# Patient Record
Sex: Male | Born: 2000 | Hispanic: Yes | Marital: Married | Smoking: Never smoker
Health system: Southern US, Community
[De-identification: ages and names within clinical notes are randomized; demographics above are authoritative.]

## PROBLEM LIST (undated history)

## (undated) ENCOUNTER — Emergency Department (HOSPITAL_COMMUNITY): Admission: EM | Payer: Self-pay | Source: Home / Self Care

## (undated) DIAGNOSIS — Z8489 Family history of other specified conditions: Secondary | ICD-10-CM

## (undated) DIAGNOSIS — T8859XA Other complications of anesthesia, initial encounter: Secondary | ICD-10-CM

## (undated) DIAGNOSIS — E559 Vitamin D deficiency, unspecified: Secondary | ICD-10-CM

## (undated) DIAGNOSIS — F410 Panic disorder [episodic paroxysmal anxiety] without agoraphobia: Secondary | ICD-10-CM

## (undated) HISTORY — PX: WISDOM TOOTH EXTRACTION: SHX21

---
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
## 2019-10-18 ENCOUNTER — Other Ambulatory Visit: Payer: Self-pay | Admitting: *Deleted

## 2019-10-18 DIAGNOSIS — Z20822 Contact with and (suspected) exposure to covid-19: Secondary | ICD-10-CM

## 2019-10-19 LAB — NOVEL CORONAVIRUS, NAA: SARS-CoV-2, NAA: NOT DETECTED

## 2020-08-17 ENCOUNTER — Encounter (HOSPITAL_COMMUNITY): Payer: Self-pay | Admitting: Emergency Medicine

## 2020-08-17 ENCOUNTER — Emergency Department (HOSPITAL_COMMUNITY)
Admission: EM | Admit: 2020-08-17 | Discharge: 2020-08-18 | Disposition: A | Discharge status: Home / Self Care | Payer: Self-pay | Attending: Emergency Medicine | Admitting: Emergency Medicine

## 2020-08-17 ENCOUNTER — Other Ambulatory Visit: Payer: Self-pay

## 2020-08-17 DIAGNOSIS — R310 Gross hematuria: Secondary | ICD-10-CM | POA: Insufficient documentation

## 2020-08-17 LAB — COMPREHENSIVE METABOLIC PANEL
ALT: 16 U/L (ref 0–44)
AST: 24 U/L (ref 15–41)
Albumin: 4.2 g/dL (ref 3.5–5.0)
Alkaline Phosphatase: 80 U/L (ref 38–126)
Anion gap: 8 (ref 5–15)
BUN: 11 mg/dL (ref 6–20)
CO2: 25 mmol/L (ref 22–32)
Calcium: 9.2 mg/dL (ref 8.9–10.3)
Chloride: 105 mmol/L (ref 98–111)
Creatinine, Ser: 0.88 mg/dL (ref 0.61–1.24)
GFR calc Af Amer: >60 mL/min (ref >60)
GFR calc non Af Amer: >60 mL/min (ref >60)
Glucose, Bld: 81 mg/dL (ref 70–99)
Potassium: 3.7 mmol/L (ref 3.5–5.1)
Sodium: 138 mmol/L (ref 135–145)
Total Bilirubin: 0.9 mg/dL (ref 0.3–1.2)
Total Protein: 7 g/dL (ref 6.5–8.1)

## 2020-08-17 LAB — URINALYSIS, ROUTINE W REFLEX MICROSCOPIC
Bacteria, UA: NONE SEEN
Bilirubin Urine: NEGATIVE
Glucose, UA: NEGATIVE mg/dL
Ketones, ur: NEGATIVE mg/dL
Leukocytes,Ua: NEGATIVE
Nitrite: NEGATIVE
Protein, ur: NEGATIVE mg/dL
RBC / HPF: >50 RBC/hpf — ABNORMAL HIGH (ref 0–5)
Specific Gravity, Urine: 1.006 (ref 1.005–1.030)
pH: 7 (ref 5.0–8.0)

## 2020-08-17 LAB — CBC
HCT: 44.1 % (ref 39.0–52.0)
Hemoglobin: 15.3 g/dL (ref 13.0–17.0)
MCH: 31.5 pg (ref 26.0–34.0)
MCHC: 34.7 g/dL (ref 30.0–36.0)
MCV: 90.7 fL (ref 80.0–100.0)
Platelets: 229 10*3/uL (ref 150–400)
RBC: 4.86 MIL/uL (ref 4.22–5.81)
RDW: 11.8 % (ref 11.5–15.5)
WBC: 10.4 10*3/uL (ref 4.0–10.5)
nRBC: 0 % (ref 0.0–0.2)

## 2020-08-17 LAB — LIPASE, BLOOD: Lipase: 28 U/L (ref 11–51)

## 2020-08-17 NOTE — ED Triage Notes (Signed)
Pt states he went swimming today and after his shower he noted blood in his urine. Blood in urine began today, and pt denies any injury.

## 2020-08-17 NOTE — ED Triage Notes (Signed)
Pt denies any pain during urination.

## 2020-08-18 ENCOUNTER — Emergency Department (HOSPITAL_COMMUNITY): Payer: Self-pay

## 2020-08-18 MED ORDER — IBUPROFEN 400 MG PO TABS
600.0000 mg | ORAL_TABLET | Freq: Once | ORAL | Status: AC
  Administered 2020-08-18: 12:00:00 600 mg via ORAL
  Filled 2020-08-18: qty 1

## 2020-08-18 NOTE — ED Provider Notes (Signed)
[facility] EMERGENCY DEPARTMENT Provider Note   CSN: [id number] Arrival date & time: 08/17/20  1914     History Chief Complaint  Patient presents with  . Hematuria    Jese Comella is a 19 y.o. male.  Patient is a 19 year old male with no significant past medical history who presents with hematuria.  He was swimming yesterday and after he got on the plane he noticed that when he urinated, there was blood in his urine.  He said it was all pink-tinged without clots.  He had another episode later in the day but has not had any blood in his urine that is visible today.  He does have some pain in his left flank that started yesterday.  It is in his left mid abdomen and radiates a little bit to his back.  Its been intermittent.  No history of kidney stones.  No fevers.  No nausea or vomiting.  No other recent illnesses.  He has not taken anything for the pain.        History reviewed. No pertinent past medical history.  There are no problems to display for this patient.   Past Surgical History:  Procedure Laterality Date  . WISDOM TOOTH EXTRACTION         No family history on file.  Social History   Tobacco Use  . Smoking status: Never Smoker  . Smokeless tobacco: Never Used  Substance Use Topics  . Alcohol use: No  . Drug use: No    Home Medications Prior to Admission medications   Medication Sig Start Date End Date Taking? Authorizing Provider  omeprazole (PRILOSEC) 20 MG capsule Take 1 capsule (20 mg total) by mouth daily. 11/19/17   Maczis, Elmer Sow, PA-C    Allergies    Patient has no known allergies.  Review of Systems   Review of Systems  Constitutional: Negative for chills, diaphoresis, fatigue and fever.  HENT: Negative for congestion, rhinorrhea and sneezing.   Eyes: Negative.   Respiratory: Negative for cough, chest tightness and shortness of breath.   Cardiovascular: Negative for chest pain and leg swelling.    Gastrointestinal: Positive for abdominal pain. Negative for blood in stool, diarrhea, nausea and vomiting.  Genitourinary: Positive for hematuria. Negative for difficulty urinating, flank pain and frequency.  Musculoskeletal: Negative for arthralgias and back pain.  Skin: Negative for rash.  Neurological: Negative for dizziness, speech difficulty, weakness, numbness and headaches.    Physical Exam Updated Vital Signs BP 100/72 (BP Location: Right Arm)   Pulse (!) 112   Temp 98.7 F (37.1 C) (Oral)   Resp 16   Ht 5\' 7"  (1.702 m)   Wt 54.4 kg   SpO2 96%   BMI 18.79 kg/m   Physical Exam Constitutional:      Appearance: He is well-developed.  HENT:     Head: Normocephalic and atraumatic.  Eyes:     Pupils: Pupils are equal, round, and reactive to light.  Cardiovascular:     Rate and Rhythm: Normal rate and regular rhythm.     Heart sounds: Normal heart sounds.  Pulmonary:     Effort: Pulmonary effort is normal. No respiratory distress.     Breath sounds: Normal breath sounds. No wheezing or rales.  Chest:     Chest wall: No tenderness.  Abdominal:     General: Bowel sounds are normal.     Palpations: Abdomen is soft.     Tenderness: There is abdominal tenderness. There is  no guarding or rebound.     Comments: Mild tenderness to the left mid abdomen, no pain to the inguinal areas or testicles  Musculoskeletal:        General: Normal range of motion.     Cervical back: Normal range of motion and neck supple.  Lymphadenopathy:     Cervical: No cervical adenopathy.  Skin:    General: Skin is warm and dry.     Findings: No rash.  Neurological:     Mental Status: He is alert and oriented to person, place, and time.     ED Results / Procedures / Treatments   Labs (all labs ordered are listed, but only abnormal results are displayed) Labs Reviewed  URINALYSIS, ROUTINE W REFLEX MICROSCOPIC - Abnormal; Notable for the following components:      Result Value   Color,  Urine STRAW (*)    Hgb urine dipstick LARGE (*)    RBC / HPF >50 (*)    All other components within normal limits  LIPASE, BLOOD  COMPREHENSIVE METABOLIC PANEL  CBC    EKG None  Radiology CT Renal Stone Study  Result Date: 08/18/2020 CLINICAL DATA:  Left lower quadrant and flank pain since this morning. EXAM: CT ABDOMEN AND PELVIS WITHOUT CONTRAST TECHNIQUE: Multidetector CT imaging of the abdomen and pelvis was performed following the standard protocol without IV contrast. COMPARISON:  11/19/2017 CT abdomen/pelvis. FINDINGS: Lower chest: No significant pulmonary nodules or acute consolidative airspace disease. Hepatobiliary: Normal liver size. No liver mass. Normal gallbladder with no radiopaque cholelithiasis. No biliary ductal dilatation. Pancreas: Normal, with no mass or duct dilation. Spleen: Normal size. No mass. Adrenals/Urinary Tract: Normal adrenals. No renal stones. No hydronephrosis. No contour deforming renal masses. Normal caliber ureters. No ureteral stones. Collapsed normal bladder. Stomach/Bowel: Normal non-distended stomach. Normal caliber small bowel with no small bowel wall thickening. Normal appendix. Normal large bowel with no diverticulosis, large bowel wall thickening or pericolonic fat stranding. Vascular/Lymphatic: Normal caliber abdominal aorta. No pathologically enlarged lymph nodes in the abdomen or pelvis. Reproductive: Normal size prostate. Other: No pneumoperitoneum, ascites or focal fluid collection. Musculoskeletal: No aggressive appearing focal osseous lesions. IMPRESSION: No acute abnormality. No urolithiasis. No hydronephrosis. No evidence of bowel obstruction or acute bowel inflammation. Electronically Signed   By: Delbert Phenix M.D.   On: 08/18/2020 11:20    Procedures Procedures (including critical care time)  Medications Ordered in ED Medications - No data to display  ED Course  I have reviewed the triage vital signs and the nursing notes.  Pertinent  labs & imaging results that were available during my care of the patient were reviewed by me and considered in my medical decision making (see chart for details).    MDM Rules/Calculators/A&P                         Patient is a 19 year old male who presents with hematuria.  He also has some left flank pain.  His urine has cleared per his report.  Urinalysis does show hematuria but does not appear to be consistent with infection.  His pain is well controlled in the ED.  He was given dose of ibuprofen.  CT scan shows no acute abnormality.  I suspect that he has a recently passed stone.  His hematuria has cleared.  His pain is minimal.  He was discharged home in good condition.  He does not have a PCP so was referred to urology to have  his urine rechecked.  Return precautions were given. Final Clinical Impression(s) / ED Diagnoses Final diagnoses:  Gross hematuria    Rx / DC Orders ED Discharge Orders    None       Rolan Bucco, MD 08/18/20 1157

## 2020-08-18 NOTE — ED Notes (Signed)
Discharge teaching reviewed with pt. Pt verbalized understanding. 

## 2021-05-21 ENCOUNTER — Encounter (HOSPITAL_BASED_OUTPATIENT_CLINIC_OR_DEPARTMENT_OTHER): Payer: Self-pay | Admitting: *Deleted

## 2021-05-21 ENCOUNTER — Other Ambulatory Visit: Payer: Self-pay

## 2021-05-21 ENCOUNTER — Other Ambulatory Visit (HOSPITAL_BASED_OUTPATIENT_CLINIC_OR_DEPARTMENT_OTHER): Payer: Self-pay

## 2021-05-21 ENCOUNTER — Emergency Department (HOSPITAL_BASED_OUTPATIENT_CLINIC_OR_DEPARTMENT_OTHER)
Admission: EM | Admit: 2021-05-21 | Discharge: 2021-05-21 | Disposition: A | Discharge status: Home / Self Care | Payer: Self-pay | Attending: Emergency Medicine | Admitting: Emergency Medicine

## 2021-05-21 DIAGNOSIS — Z202 Contact with and (suspected) exposure to infections with a predominantly sexual mode of transmission: Secondary | ICD-10-CM | POA: Insufficient documentation

## 2021-05-21 LAB — URINALYSIS, ROUTINE W REFLEX MICROSCOPIC
Bilirubin Urine: NEGATIVE
Glucose, UA: NEGATIVE mg/dL
Hgb urine dipstick: NEGATIVE
Ketones, ur: NEGATIVE mg/dL
Leukocytes,Ua: NEGATIVE
Nitrite: NEGATIVE
Protein, ur: NEGATIVE mg/dL
Specific Gravity, Urine: 1.02 (ref 1.005–1.030)
pH: 6.5 (ref 5.0–8.0)

## 2021-05-21 LAB — HIV ANTIBODY (ROUTINE TESTING W REFLEX): HIV Screen 4th Generation wRfx: NONREACTIVE

## 2021-05-21 MED ORDER — LIDOCAINE HCL (PF) 1 % IJ SOLN
1.0000 mL | Freq: Once | INTRAMUSCULAR | Status: AC
  Administered 2021-05-21: 1 mL
  Filled 2021-05-21: qty 5

## 2021-05-21 MED ORDER — CEFTRIAXONE SODIUM 500 MG IJ SOLR
500.0000 mg | Freq: Once | INTRAMUSCULAR | Status: AC
  Administered 2021-05-21: 500 mg via INTRAMUSCULAR
  Filled 2021-05-21: qty 500

## 2021-05-21 MED ORDER — DOXYCYCLINE HYCLATE 100 MG PO CAPS
100.0000 mg | ORAL_CAPSULE | Freq: Two times a day (BID) | ORAL | 0 refills | Status: DC
  Filled 2021-05-21: qty 20, 10d supply, fill #0

## 2021-05-21 NOTE — ED Provider Notes (Signed)
[facility] EMERGENCY DEPARTMENT Provider Note   CSN: [id number] Arrival date & time: 05/21/21  0900     History Chief Complaint  Patient presents with  . STD evaluation    Caleb Glover is a 20 y.o. male.  He is here with a complaint of 1 week of dysuria.  He found out that his sexual contact tested positive for chlamydia.  Denies any other complaints.  The history is provided by the patient.  Male GU Problem Presenting symptoms: dysuria   Context: after urination   Relieved by:  None tried Worsened by:  Urination Ineffective treatments:  None tried Associated symptoms: no abdominal pain, no fever, no nausea, no scrotal swelling and no vomiting   Risk factors: STI exposure and unprotected sex        No past medical history on file.  There are no problems to display for this patient.   Past Surgical History:  Procedure Laterality Date  . WISDOM TOOTH EXTRACTION         No family history on file.  Social History   Tobacco Use  . Smoking status: Never Smoker  . Smokeless tobacco: Never Used  Substance Use Topics  . Alcohol use: No  . Drug use: No    Home Medications Prior to Admission medications   Medication Sig Start Date End Date Taking? Authorizing Provider  omeprazole (PRILOSEC) 20 MG capsule Take 1 capsule (20 mg total) by mouth daily. 11/19/17   Maczis, Elmer Sow, PA-C    Allergies    Patient has no known allergies.  Review of Systems   Review of Systems  Constitutional: Negative for fever.  Gastrointestinal: Negative for abdominal pain, nausea and vomiting.  Genitourinary: Positive for dysuria. Negative for scrotal swelling.    Physical Exam Updated Vital Signs BP 103/71 (BP Location: Right Arm)   Pulse 77   Temp 98.4 F (36.9 C) (Oral)   Resp 16   Ht 5\' 6"  (1.676 m)   Wt 58.5 kg   SpO2 100%   BMI 20.82 kg/m   Physical Exam Vitals and nursing note reviewed.  Constitutional:      Appearance: Normal appearance.  He is well-developed.  HENT:     Head: Normocephalic and atraumatic.  Eyes:     Conjunctiva/sclera: Conjunctivae normal.  Pulmonary:     Effort: Pulmonary effort is normal.  Genitourinary:    Penis: Normal.      Testes: Normal.     Comments: Minimal amount of clear discharge Musculoskeletal:     Cervical back: Neck supple.  Skin:    General: Skin is warm and dry.  Neurological:     Mental Status: He is alert.     GCS: GCS eye subscore is 4. GCS verbal subscore is 5. GCS motor subscore is 6.     ED Results / Procedures / Treatments   Labs (all labs ordered are listed, but only abnormal results are displayed) Labs Reviewed  URINALYSIS, ROUTINE W REFLEX MICROSCOPIC  HIV ANTIBODY (ROUTINE TESTING W REFLEX)  RPR  GC/CHLAMYDIA PROBE AMP () NOT AT [facility]    EKG None  Radiology No results found.  Procedures Procedures   Medications Ordered in ED Medications - No data to display  ED Course  I have reviewed the triage vital signs and the nursing notes.  Pertinent labs & imaging results that were available during my care of the patient were reviewed by me and considered in my medical decision making (see chart for details).  Clinical Course as of 05/21/21 1742  Mon May 21, 2021  7026 Patient declined HIV and RPR. [MB]    Clinical Course User Index [MB] Terrilee Files, MD   MDM Rules/Calculators/A&P                          Now patient states he will take the HIV and RPR testing.  Will cover empirically as he has having active unprotected sex with chlamydia positive contacts.  Will cover for gonorrhea.  Recommended barrier protection.  Return instructions discussed Final Clinical Impression(s) / ED Diagnoses Final diagnoses:  STD exposure    Rx / DC Orders ED Discharge Orders         Ordered    doxycycline (VIBRAMYCIN) 100 MG capsule  2 times daily        05/21/21 0930           Terrilee Files, MD 05/21/21 1744

## 2021-05-21 NOTE — ED Notes (Signed)
AVS reviewed with client, assisted client with setting up MyChart on his mobile device. Discussed safe sex practices, Opportunity for questions provided prior to leaving ED.
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
## 2021-05-22 LAB — GC/CHLAMYDIA PROBE AMP (~~LOC~~) NOT AT ARMC
Chlamydia: POSITIVE — AB
Comment: NEGATIVE
Comment: NORMAL
Neisseria Gonorrhea: NEGATIVE

## 2021-05-22 LAB — RPR: RPR Ser Ql: NONREACTIVE

## 2021-06-11 ENCOUNTER — Other Ambulatory Visit: Payer: Self-pay

## 2021-06-11 ENCOUNTER — Emergency Department (HOSPITAL_BASED_OUTPATIENT_CLINIC_OR_DEPARTMENT_OTHER)
Admission: EM | Admit: 2021-06-11 | Discharge: 2021-06-11 | Disposition: A | Discharge status: Home / Self Care | Payer: Self-pay | Attending: Emergency Medicine | Admitting: Emergency Medicine

## 2021-06-11 ENCOUNTER — Encounter (HOSPITAL_BASED_OUTPATIENT_CLINIC_OR_DEPARTMENT_OTHER): Payer: Self-pay | Admitting: *Deleted

## 2021-06-11 DIAGNOSIS — R5383 Other fatigue: Secondary | ICD-10-CM | POA: Diagnosis not present

## 2021-06-11 DIAGNOSIS — R1030 Lower abdominal pain, unspecified: Secondary | ICD-10-CM | POA: Insufficient documentation

## 2021-06-11 DIAGNOSIS — R350 Frequency of micturition: Secondary | ICD-10-CM | POA: Insufficient documentation

## 2021-06-11 DIAGNOSIS — Z28310 Unvaccinated for covid-19: Secondary | ICD-10-CM | POA: Diagnosis not present

## 2021-06-11 DIAGNOSIS — Z20822 Contact with and (suspected) exposure to covid-19: Secondary | ICD-10-CM | POA: Diagnosis not present

## 2021-06-11 LAB — URINALYSIS, ROUTINE W REFLEX MICROSCOPIC
Bilirubin Urine: NEGATIVE
Glucose, UA: NEGATIVE mg/dL
Hgb urine dipstick: NEGATIVE
Ketones, ur: NEGATIVE mg/dL
Leukocytes,Ua: NEGATIVE
Nitrite: NEGATIVE
Protein, ur: NEGATIVE mg/dL
Specific Gravity, Urine: 1.01 (ref 1.005–1.030)
pH: 6.5 (ref 5.0–8.0)

## 2021-06-11 NOTE — ED Triage Notes (Addendum)
Nausea. Abdominal pain. Fatigue. States he was recently treated for Chlamydia and wants to make sure it is gone. He completed meds as directed. He appears anxious.

## 2021-06-11 NOTE — ED Provider Notes (Signed)
[facility] EMERGENCY DEPARTMENT Provider Note   CSN: [id number] Arrival date & time: 06/11/21  1716     History Chief Complaint  Patient presents with   Fatigue   SEXUALLY TRANSMITTED DISEASE    Caleb Glover is a 20 y.o. male.  HPI  Patient is a 20 year old male who presents to the emergency department due to fatigue.  States his symptoms started yesterday.  Reports associated lower abdominal tightness as well as increased urinary frequency.  States that he was recently treated for chlamydia and completed his doxycycline about 6 days ago.  He was initially feeling normal after finishing his antibiotics.  States he is very anxious about his symptoms.  He has not been vaccinated and denies a history of COVID-19 infection.  Denies any dysuria, hematuria, penile discharge, penile rashes, penile pain, scrotal pain, fevers, chills, diarrhea, vomiting, chest pain, shortness of breath, URI symptoms.     History reviewed. No pertinent past medical history.  There are no problems to display for this patient.   Past Surgical History:  Procedure Laterality Date   WISDOM TOOTH EXTRACTION         No family history on file.  Social History   Tobacco Use   Smoking status: Never   Smokeless tobacco: Never  Substance Use Topics   Alcohol use: No   Drug use: No    Home Medications Prior to Admission medications   Medication Sig Start Date End Date Taking? Authorizing Provider  doxycycline (VIBRAMYCIN) 100 MG capsule Take 1 capsule (100 mg total) by mouth 2 (two) times daily. 05/21/21   Terrilee Files, MD  omeprazole (PRILOSEC) 20 MG capsule Take 1 capsule (20 mg total) by mouth daily. 11/19/17   Maczis, Elmer Sow, PA-C    Allergies    Patient has no known allergies.  Review of Systems   Review of Systems  Constitutional:  Positive for fatigue. Negative for chills and fever.  HENT:  Negative for congestion, rhinorrhea and sore throat.   Respiratory:   Negative for shortness of breath.   Cardiovascular:  Negative for chest pain.  Gastrointestinal:  Positive for abdominal pain and nausea. Negative for diarrhea and vomiting.  Genitourinary:  Positive for frequency. Negative for decreased urine volume, difficulty urinating, dysuria, flank pain, hematuria, penile discharge, penile pain, penile swelling, scrotal swelling, testicular pain and urgency.   Physical Exam Updated Vital Signs BP 107/68 (BP Location: Right Arm)   Pulse 69   Temp 98.6 F (37 C) (Oral)   Resp 18   Ht 5\' 6"  (1.676 m)   Wt 58.5 kg   SpO2 100%   BMI 20.82 kg/m   Physical Exam Vitals and nursing note reviewed.  Constitutional:      General: He is not in acute distress.    Appearance: Normal appearance. He is not ill-appearing, toxic-appearing or diaphoretic.  HENT:     Head: Normocephalic and atraumatic.     Right Ear: External ear normal.     Left Ear: External ear normal.     Nose: Nose normal.     Mouth/Throat:     Mouth: Mucous membranes are moist.     Pharynx: Oropharynx is clear. No oropharyngeal exudate or posterior oropharyngeal erythema.     Comments: Uvula midline.  No erythema noted in the posterior oropharynx.  No exudates.  Readily handling secretions. Eyes:     General: No scleral icterus.       Right eye: No discharge.  Left eye: No discharge.     Extraocular Movements: Extraocular movements intact.     Conjunctiva/sclera: Conjunctivae normal.  Cardiovascular:     Rate and Rhythm: Normal rate and regular rhythm.     Pulses: Normal pulses.     Heart sounds: Normal heart sounds. No murmur heard.   No friction rub. No gallop.  Pulmonary:     Effort: Pulmonary effort is normal. No respiratory distress.     Breath sounds: Normal breath sounds. No stridor. No wheezing, rhonchi or rales.  Abdominal:     General: Abdomen is flat.     Palpations: Abdomen is soft.     Tenderness: There is no abdominal tenderness.     Comments: Abdomen is  flat, soft, and nontender in all 4 quadrants.  Musculoskeletal:        General: Normal range of motion.     Cervical back: Normal range of motion and neck supple. No tenderness.  Skin:    General: Skin is warm and dry.  Neurological:     General: No focal deficit present.     Mental Status: He is alert and oriented to person, place, and time.  Psychiatric:        Mood and Affect: Mood normal.        Behavior: Behavior normal.    ED Results / Procedures / Treatments   Labs (all labs ordered are listed, but only abnormal results are displayed) Labs Reviewed  SARS CORONAVIRUS 2 (TAT 6-24 HRS)  URINALYSIS, ROUTINE W REFLEX MICROSCOPIC  GC/CHLAMYDIA PROBE AMP ([place]) NOT AT [facility]   EKG None  Radiology No results found.  Procedures Procedures   Medications Ordered in ED Medications - No data to display  ED Course  I have reviewed the triage vital signs and the nursing notes.  Pertinent labs & imaging results that were available during my care of the patient were reviewed by me and considered in my medical decision making (see chart for details).    MDM Rules/Calculators/A&P                          Pt is a 19 y.o. male who presents to the emergency department with fatigue, urinary frequency, as well as intermittent abdominal tightness.  Labs: UA is negative. GC/chlamydia is pending. COVID-19 test is pending.  I, Placido Sou, PA-C, personally reviewed and evaluated these images and lab results as part of my medical decision-making.  Unsure of the source of the patient's symptoms.  His physical exam was benign.  His wife notes that he has a history of anxiety and feels that he is extremely anxious about his symptoms due to his recent chlamydia diagnosis.  UA reassuring.  GC/chlamydia as well as COVID-19 test is pending.  He is going to check these results on MyChart.  Patient made aware that if he tests positive for chlamydia once again he will need to return to  his PCP or the health department for treatment.    Feel the patient is stable for discharge at this time and he is agreeable.  Given strict return precautions.  His questions were answered and he was amicable at the time of discharge.  Note: Portions of this report may have been transcribed using voice recognition software. Every effort was made to ensure accuracy; however, inadvertent computerized transcription errors may be present.   Final Clinical Impression(s) / ED Diagnoses Final diagnoses:  Fatigue, unspecified type  Urinary frequency  Rx / DC Orders ED Discharge Orders     None        Placido Sou, Cordelia Poche 06/11/21 1847    Tilden Fossa, MD 06/14/21 [phone]

## 2021-06-11 NOTE — Discharge Instructions (Addendum)
Please check the results of your gonorrhea/chlamydia test as well as your COVID-19 test on MyChart.  They should both be available in the next 24 to 48 hours.  If you develop any new or worsening symptoms, please come back to the emergency department.  It was a pleasure to meet you.

## 2021-06-12 ENCOUNTER — Emergency Department (HOSPITAL_BASED_OUTPATIENT_CLINIC_OR_DEPARTMENT_OTHER): Payer: Medicaid Other

## 2021-06-12 ENCOUNTER — Other Ambulatory Visit: Payer: Self-pay

## 2021-06-12 ENCOUNTER — Emergency Department (HOSPITAL_BASED_OUTPATIENT_CLINIC_OR_DEPARTMENT_OTHER)
Admission: EM | Admit: 2021-06-12 | Discharge: 2021-06-13 | Disposition: A | Discharge status: Home / Self Care | Payer: Medicaid Other | Attending: Emergency Medicine | Admitting: Emergency Medicine

## 2021-06-12 ENCOUNTER — Encounter (HOSPITAL_BASED_OUTPATIENT_CLINIC_OR_DEPARTMENT_OTHER): Payer: Self-pay | Admitting: *Deleted

## 2021-06-12 DIAGNOSIS — S0990XA Unspecified injury of head, initial encounter: Secondary | ICD-10-CM | POA: Diagnosis present

## 2021-06-12 DIAGNOSIS — R519 Headache, unspecified: Secondary | ICD-10-CM

## 2021-06-12 DIAGNOSIS — S060X0A Concussion without loss of consciousness, initial encounter: Secondary | ICD-10-CM | POA: Diagnosis not present

## 2021-06-12 DIAGNOSIS — X58XXXA Exposure to other specified factors, initial encounter: Secondary | ICD-10-CM | POA: Insufficient documentation

## 2021-06-12 LAB — SARS CORONAVIRUS 2 (TAT 6-24 HRS): SARS Coronavirus 2: NEGATIVE

## 2021-06-12 MED ORDER — SODIUM CHLORIDE 0.9 % IV BOLUS
1000.0000 mL | Freq: Once | INTRAVENOUS | Status: AC
  Administered 2021-06-12: 1000 mL via INTRAVENOUS

## 2021-06-12 MED ORDER — PROCHLORPERAZINE EDISYLATE 10 MG/2ML IJ SOLN
10.0000 mg | Freq: Once | INTRAMUSCULAR | Status: AC
  Administered 2021-06-12: 10 mg via INTRAVENOUS
  Filled 2021-06-12: qty 2

## 2021-06-12 MED ORDER — DIPHENHYDRAMINE HCL 50 MG/ML IJ SOLN
50.0000 mg | Freq: Once | INTRAMUSCULAR | Status: AC
  Administered 2021-06-12: 50 mg via INTRAVENOUS
  Filled 2021-06-12: qty 1

## 2021-06-12 NOTE — ED Triage Notes (Signed)
Headache. He was seen for the same yesterday. Fatigue. Nausea. Light headed. His Covid and chlamydia were both negative.
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
## 2021-06-13 LAB — GC/CHLAMYDIA PROBE AMP (~~LOC~~) NOT AT ARMC
Chlamydia: NEGATIVE
Comment: NEGATIVE
Comment: NORMAL
Neisseria Gonorrhea: NEGATIVE
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
## 2021-06-28 ENCOUNTER — Other Ambulatory Visit: Payer: Self-pay

## 2021-06-28 ENCOUNTER — Encounter (HOSPITAL_BASED_OUTPATIENT_CLINIC_OR_DEPARTMENT_OTHER): Payer: Self-pay | Admitting: *Deleted

## 2021-06-28 ENCOUNTER — Emergency Department (HOSPITAL_BASED_OUTPATIENT_CLINIC_OR_DEPARTMENT_OTHER)
Admission: EM | Admit: 2021-06-28 | Discharge: 2021-06-28 | Disposition: A | Discharge status: Home / Self Care | Payer: Medicaid Other | Attending: Emergency Medicine | Admitting: Emergency Medicine

## 2021-06-28 DIAGNOSIS — N4889 Other specified disorders of penis: Secondary | ICD-10-CM | POA: Diagnosis present

## 2021-06-28 DIAGNOSIS — N509 Disorder of male genital organs, unspecified: Secondary | ICD-10-CM | POA: Diagnosis not present

## 2021-06-28 DIAGNOSIS — N489 Disorder of penis, unspecified: Secondary | ICD-10-CM

## 2021-06-28 NOTE — Discharge Instructions (Addendum)
Overall think that you have an inflamed hair follicle.  Have low suspicion for an STD at this time.  This will likely improve on its own.  Please return if symptoms worsen.
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
## 2021-06-28 NOTE — ED Provider Notes (Signed)
[facility] EMERGENCY DEPARTMENT Provider Note   CSN: [id number] Arrival date & time: 06/28/21  1854     History Chief Complaint  Patient presents with   Penis Pain    lesion    Caleb Glover is a 20 y.o. male.  Patient with 1 penile lesion.  Shaved 3 days ago.  No exposure to STDs.  Wife tested positive for bacterial vaginosis.  The history is provided by the patient.  Penis Pain This is a new problem. The current episode started yesterday. The problem occurs constantly. The problem has not changed since onset.Nothing aggravates the symptoms. Nothing relieves the symptoms. He has tried nothing for the symptoms. The treatment provided no relief.      History reviewed. No pertinent past medical history.  There are no problems to display for this patient.   Past Surgical History:  Procedure Laterality Date   WISDOM TOOTH EXTRACTION         No family history on file.  Social History   Tobacco Use   Smoking status: Never   Smokeless tobacco: Never  Substance Use Topics   Alcohol use: No   Drug use: No    Home Medications Prior to Admission medications   Medication Sig Start Date End Date Taking? Authorizing Provider  doxycycline (VIBRAMYCIN) 100 MG capsule Take 1 capsule (100 mg total) by mouth 2 (two) times daily. 05/21/21   Terrilee Files, MD  omeprazole (PRILOSEC) 20 MG capsule Take 1 capsule (20 mg total) by mouth daily. 11/19/17   Maczis, Elmer Sow, PA-C    Allergies    Patient has no known allergies.  Review of Systems   Review of Systems  Genitourinary:  Positive for genital sores and penile pain. Negative for decreased urine volume, penile discharge, penile swelling, scrotal swelling, testicular pain and urgency.   Physical Exam Updated Vital Signs BP 119/77   Pulse 81   Temp 98.1 F (36.7 C) (Oral)   Resp 16   Ht 5\' 6"  (1.676 m)   Wt 49.9 kg   SpO2 99%   BMI 17.75 kg/m   Physical Exam Constitutional:      General:  He is not in acute distress.    Appearance: He is not ill-appearing.  Genitourinary:    Testes: Normal.     Comments: There is 1 small pinpoint raised lesion that is nonulcerated, nontender Neurological:     Mental Status: He is alert.    ED Results / Procedures / Treatments   Labs (all labs ordered are listed, but only abnormal results are displayed) Labs Reviewed  GC/CHLAMYDIA PROBE AMP ([place]) NOT AT [facility]    EKG None  Radiology No results found.  Procedures Procedures   Medications Ordered in ED Medications - No data to display  ED Course  I have reviewed the triage vital signs and the nursing notes.  Pertinent labs & imaging results that were available during my care of the patient were reviewed by me and considered in my medical decision making (see chart for details).    MDM Rules/Calculators/A&P                          Caleb Glover is here with a penile lesion.  Normal vitals.  No fever.  No concern for STD.  Wife wishes tested and negative for all STDs.  She tested positive for BV.  Patient shaved his penis and scrotum 3 days ago.  He appears  to have 1 area of razor burn.  Lesion does not appear consistent with herpes or syphilis.  He is not having any tenderness there.  This chart was dictated using voice recognition software.  Despite best efforts to proofread,  errors can occur which can change the documentation meaning.   Final Clinical Impression(s) / ED Diagnoses Final diagnoses:  Penile lesion    Rx / DC Orders ED Discharge Orders     None        Virgina Norfolk, DO 06/28/21 1919

## 2021-06-29 LAB — GC/CHLAMYDIA PROBE AMP (~~LOC~~) NOT AT ARMC
Chlamydia: NEGATIVE
Comment: NEGATIVE
Comment: NORMAL
Neisseria Gonorrhea: NEGATIVE

## 2021-06-29 LAB — RPR: RPR Ser Ql: NONREACTIVE

## 2021-07-02 ENCOUNTER — Emergency Department (HOSPITAL_BASED_OUTPATIENT_CLINIC_OR_DEPARTMENT_OTHER)
Admission: EM | Admit: 2021-07-02 | Discharge: 2021-07-02 | Disposition: A | Discharge status: Home / Self Care | Payer: Medicaid Other | Attending: Emergency Medicine | Admitting: Emergency Medicine

## 2021-07-02 ENCOUNTER — Other Ambulatory Visit: Payer: Self-pay

## 2021-07-02 ENCOUNTER — Encounter (HOSPITAL_BASED_OUTPATIENT_CLINIC_OR_DEPARTMENT_OTHER): Payer: Self-pay | Admitting: Emergency Medicine

## 2021-07-02 DIAGNOSIS — Y929 Unspecified place or not applicable: Secondary | ICD-10-CM | POA: Diagnosis not present

## 2021-07-02 DIAGNOSIS — T23032A Burn of unspecified degree of multiple left fingers (nail), not including thumb, initial encounter: Secondary | ICD-10-CM | POA: Diagnosis present

## 2021-07-02 DIAGNOSIS — X131XXA Other contact with steam and other hot vapors, initial encounter: Secondary | ICD-10-CM | POA: Diagnosis not present

## 2021-07-02 DIAGNOSIS — S60427A Blister (nonthermal) of left little finger, initial encounter: Secondary | ICD-10-CM | POA: Insufficient documentation

## 2021-07-02 DIAGNOSIS — Y9389 Activity, other specified: Secondary | ICD-10-CM | POA: Diagnosis not present

## 2021-07-02 DIAGNOSIS — T23202A Burn of second degree of left hand, unspecified site, initial encounter: Secondary | ICD-10-CM | POA: Insufficient documentation

## 2021-07-02 DIAGNOSIS — T23162A Burn of first degree of back of left hand, initial encounter: Secondary | ICD-10-CM

## 2021-07-02 NOTE — ED Notes (Signed)
See EDP assessment 

## 2021-07-02 NOTE — ED Triage Notes (Signed)
Pt states he burned the posterior of his R hand on steam while turning off his grill ~ 4 hours ago. He states he put aloe on it. Small blister to 4th digit, intact. Tylenol taken PTA.

## 2021-07-02 NOTE — Discharge Instructions (Addendum)
You can apply Aloe vera with lidocaine to help sooth pain from burn. Keep area clean and away from the sun or other heat sources.

## 2021-07-02 NOTE — ED Provider Notes (Signed)
[facility] EMERGENCY DEPARTMENT Provider Note  CSN: [id number] Arrival date & time: 07/02/21 0054  Chief Complaint(s) Hand Burn  HPI Caleb Glover is a 20 y.o. male here for burn to the left hand that occurred approximately 4 hours prior to arrival.  This occurred after he poured water on a cold grill while trying put it off.  He reports that the steam came up and burned his hand.  Patient took Tylenol for the pain and then applied aloe vera which helped alleviate the pain.  Pain progressively got worse and he developed a blister prompting his visit.  Pain is worse with movement of the fingers and palpation.  Alleviated by the aloe vera and Tylenol.  No other injuries.  HPI  Past Medical History History reviewed. No pertinent past medical history. There are no problems to display for this patient.  Home Medication(s) Prior to Admission medications   Medication Sig Start Date End Date Taking? Authorizing Provider  doxycycline (VIBRAMYCIN) 100 MG capsule Take 1 capsule (100 mg total) by mouth 2 (two) times daily. 05/21/21   Terrilee Files, MD  omeprazole (PRILOSEC) 20 MG capsule Take 1 capsule (20 mg total) by mouth daily. 11/19/17   Maczis, Elmer Sow, PA-C                                                                                                                                    Past Surgical History Past Surgical History:  Procedure Laterality Date   WISDOM TOOTH EXTRACTION     Family History No family history on file.  Social History Social History   Tobacco Use   Smoking status: Never   Smokeless tobacco: Never  Substance Use Topics   Alcohol use: No   Drug use: No   Allergies Patient has no known allergies.  Review of Systems Review of Systems All other systems are reviewed and are negative for acute change except as noted in the HPI  Physical Exam Vital Signs  I have reviewed the triage vital signs BP 104/74 (BP Location: Right Arm)    Pulse 70   Temp 97.8 F (36.6 C) (Oral)   Resp 18   Ht 5\' 6"  (1.676 m)   Wt 51.8 kg   SpO2 100%   BMI 18.42 kg/m   Physical Exam Vitals reviewed.  Constitutional:      General: He is not in acute distress.    Appearance: He is well-developed. He is not diaphoretic.  HENT:     Head: Normocephalic and atraumatic.     Right Ear: External ear normal.     Left Ear: External ear normal.     Nose: Nose normal.     Mouth/Throat:     Mouth: Mucous membranes are moist.  Eyes:     General: No scleral icterus.    Conjunctiva/sclera: Conjunctivae normal.  Neck:     Trachea: Phonation normal.  Cardiovascular:  Rate and Rhythm: Normal rate and regular rhythm.  Pulmonary:     Effort: Pulmonary effort is normal. No respiratory distress.     Breath sounds: No stridor.  Abdominal:     General: There is no distension.  Musculoskeletal:        General: Normal range of motion.     Cervical back: Normal range of motion.  Skin:    Findings: Burn present.     Comments: Superficial burn to the dorsum of the left hand fingers. Small intact blister over the 3rd and 4th fingers. Still able to range.  Neurological:     Mental Status: He is alert and oriented to person, place, and time.  Psychiatric:        Behavior: Behavior normal.    ED Results and Treatments Labs (all labs ordered are listed, but only abnormal results are displayed) Labs Reviewed - No data to display                                                                                                                       EKG  EKG Interpretation  Date/Time:    Ventricular Rate:    PR Interval:    QRS Duration:   QT Interval:    QTC Calculation:   R Axis:     Text Interpretation:          Radiology No results found.  Pertinent labs & imaging results that were available during my care of the patient were reviewed by me and considered in my medical decision making (see chart for details).  Medications Ordered in  ED Medications - No data to display                                                                                                                                  Procedures Procedures  (including critical care time)  Medical Decision Making / ED Course I have reviewed the nursing notes for this encounter and the patient's prior records (if available in EHR or on provided paperwork).   Marcia Lepera was evaluated in Emergency Department on 07/02/2021 for the symptoms described in the history of present illness. He was evaluated in the context of the global COVID-19 pandemic, which necessitated consideration that the patient might be at risk for infection with the SARS-CoV-2 virus that causes COVID-19. Institutional protocols and algorithms that pertain to the evaluation of patients at risk for  COVID-19 are in a state of rapid change based on information released by regulatory bodies including the CDC and federal and state organizations. These policies and algorithms were followed during the patient's care in the ED.  Mostly superficial thermal burn to the dorsum of left hand fingers. Small area of superficial partial thickness burn. Recommended continued supportive management.      Final Clinical Impression(s) / ED Diagnoses Final diagnoses:  Superficial burn of back of left hand, initial encounter   The patient appears reasonably screened and/or stabilized for discharge and I doubt any other medical condition or other [facility] requiring further screening, evaluation, or treatment in the ED at this time prior to discharge. Safe for discharge with strict return precautions.  Disposition: Discharge  Condition: Good  I have discussed the results, Dx and Tx plan with the patient/family who expressed understanding and agree(s) with the plan. Discharge instructions discussed at length. The patient/family was given strict return precautions who verbalized understanding of the instructions.  No further questions at time of discharge.    ED Discharge Orders     None       Follow Up: Primary care provider  Call  as needed  Dillingham, Alena Bills, DO Plastic Surgery [address] [place] [address] [phone] Call  as needed      This chart was dictated using voice recognition software.  Despite best efforts to proofread,  errors can occur which can change the documentation meaning.    Nira Conn, MD 07/02/21 [phone]

## 2021-07-06 ENCOUNTER — Emergency Department (HOSPITAL_BASED_OUTPATIENT_CLINIC_OR_DEPARTMENT_OTHER): Payer: Medicaid Other

## 2021-07-06 ENCOUNTER — Encounter (HOSPITAL_BASED_OUTPATIENT_CLINIC_OR_DEPARTMENT_OTHER): Payer: Self-pay | Admitting: *Deleted

## 2021-07-06 ENCOUNTER — Other Ambulatory Visit: Payer: Self-pay

## 2021-07-06 ENCOUNTER — Emergency Department (HOSPITAL_BASED_OUTPATIENT_CLINIC_OR_DEPARTMENT_OTHER)
Admission: EM | Admit: 2021-07-06 | Discharge: 2021-07-07 | Disposition: A | Discharge status: Home / Self Care | Payer: Medicaid Other | Attending: Emergency Medicine | Admitting: Emergency Medicine

## 2021-07-06 DIAGNOSIS — Z20822 Contact with and (suspected) exposure to covid-19: Secondary | ICD-10-CM | POA: Insufficient documentation

## 2021-07-06 DIAGNOSIS — F419 Anxiety disorder, unspecified: Secondary | ICD-10-CM | POA: Diagnosis not present

## 2021-07-06 DIAGNOSIS — R072 Precordial pain: Secondary | ICD-10-CM

## 2021-07-06 DIAGNOSIS — R0989 Other specified symptoms and signs involving the circulatory and respiratory systems: Secondary | ICD-10-CM

## 2021-07-06 MED ORDER — ALUM & MAG HYDROXIDE-SIMETH 200-200-20 MG/5ML PO SUSP
30.0000 mL | Freq: Once | ORAL | Status: AC
  Administered 2021-07-06: 30 mL via ORAL
  Filled 2021-07-06: qty 30

## 2021-07-06 NOTE — ED Notes (Signed)
Pt. Reports he has some anxiety and is shaking in the stretcher.

## 2021-07-06 NOTE — ED Triage Notes (Signed)
Panic attacks. States he was wondering if he could get some mediation for anxiety. He had chest pain earlier tonight and EMS was called. They told him he was fine but to come to the ER if he needed to. No distress at triage.

## 2021-07-07 ENCOUNTER — Encounter (HOSPITAL_BASED_OUTPATIENT_CLINIC_OR_DEPARTMENT_OTHER): Payer: Self-pay | Admitting: Emergency Medicine

## 2021-07-07 LAB — SARS CORONAVIRUS 2 (TAT 6-24 HRS): SARS Coronavirus 2: NEGATIVE

## 2021-07-07 NOTE — ED Provider Notes (Signed)
[facility] EMERGENCY DEPARTMENT Provider Note   CSN: [id number] Arrival date & time: 07/06/21  2203     History Chief Complaint  Patient presents with   Chest Pain    Caleb Glover is a 20 y.o. male.  The history is provided by the patient.  Chest Pain Pain location:  Substernal area Pain quality: dull   Pain radiates to:  Does not radiate Pain severity:  Moderate Onset quality:  Gradual Duration: hours. Timing:  Constant Progression:  Resolved Chronicity:  New Context comment:  Anxiety Relieved by:  Nothing Worsened by:  Nothing Ineffective treatments:  None tried Associated symptoms: anxiety   Associated symptoms: no anorexia, no cough, no fever, no headache, no lower extremity edema, no nausea, no orthopnea, no palpitations, no PND, no shortness of breath, no vomiting and no weakness   Risk factors: male sex   Risk factors comment:  No travel no leg pain Patient with no PMH had CP earlier with panic attack related to unknown cause.  No f/c/r.  No travel no leg pain.      History reviewed. No pertinent past medical history.  There are no problems to display for this patient.   Past Surgical History:  Procedure Laterality Date   WISDOM TOOTH EXTRACTION         History reviewed. No pertinent family history.  Social History   Tobacco Use   Smoking status: Never   Smokeless tobacco: Never  Substance Use Topics   Alcohol use: No   Drug use: No    Home Medications Prior to Admission medications   Medication Sig Start Date End Date Taking? Authorizing Provider  doxycycline (VIBRAMYCIN) 100 MG capsule Take 1 capsule (100 mg total) by mouth 2 (two) times daily. 05/21/21   Terrilee Files, MD  omeprazole (PRILOSEC) 20 MG capsule Take 1 capsule (20 mg total) by mouth daily. 11/19/17   Maczis, Elmer Sow, PA-C    Allergies    Patient has no known allergies.  Review of Systems   Review of Systems  Constitutional:  Negative for fever.   HENT:  Negative for drooling.   Eyes:  Negative for redness.  Respiratory:  Negative for cough and shortness of breath.   Cardiovascular:  Positive for chest pain. Negative for palpitations, orthopnea and PND.  Gastrointestinal:  Negative for anorexia, nausea and vomiting.  Genitourinary:  Negative for difficulty urinating.  Musculoskeletal:  Negative for gait problem.  Skin:  Negative for rash.  Neurological:  Negative for weakness and headaches.  Psychiatric/Behavioral:  Negative for agitation. The patient is nervous/anxious.   All other systems reviewed and are negative.  Physical Exam Updated Vital Signs BP 113/67 (BP Location: Right Arm)   Pulse 67   Temp 98 F (36.7 C) (Oral)   Resp 14   Ht 5\' 6"  (1.676 m)   Wt 49.9 kg   SpO2 100%   BMI 17.76 kg/m   Physical Exam Vitals and nursing note reviewed.  Constitutional:      General: He is not in acute distress.    Appearance: Normal appearance.     Comments: Sleeping in the room   HENT:     Head: Normocephalic and atraumatic.     Nose: Nose normal.  Eyes:     Conjunctiva/sclera: Conjunctivae normal.     Pupils: Pupils are equal, round, and reactive to light.  Cardiovascular:     Rate and Rhythm: Normal rate and regular rhythm.     Pulses: Normal pulses.  Heart sounds: Normal heart sounds.  Pulmonary:     Effort: Pulmonary effort is normal.     Breath sounds: Normal breath sounds.  Abdominal:     General: Abdomen is flat. Bowel sounds are normal.     Palpations: Abdomen is soft.     Tenderness: There is no abdominal tenderness. There is no guarding.  Musculoskeletal:        General: No tenderness. Normal range of motion.     Cervical back: Normal range of motion and neck supple.     Right lower leg: No edema.     Left lower leg: No edema.  Skin:    General: Skin is warm and dry.     Capillary Refill: Capillary refill takes less than 2 seconds.  Neurological:     General: No focal deficit present.      Mental Status: He is alert and oriented to person, place, and time.     Deep Tendon Reflexes: Reflexes normal.  Psychiatric:        Mood and Affect: Mood normal.        Behavior: Behavior normal.    ED Results / Procedures / Treatments   Labs (all labs ordered are listed, but only abnormal results are displayed) Labs Reviewed  SARS CORONAVIRUS 2 (TAT 6-24 HRS)    EKG EKG Interpretation  Date/Time:  Friday July 06 2021 22:13:54 EDT Ventricular Rate:  76 PR Interval:  156 QRS Duration: 86 QT Interval:  370 QTC Calculation: 416 R Axis:   73 Text Interpretation: Normal sinus rhythm Normal ECG EKG WITHIN NORMAL LIMITS Confirmed by Frederick Peers [id number]) on 07/06/2021 10:33:07 PM  Radiology DG Chest Portable 1 View  Result Date: 07/06/2021 CLINICAL DATA:  Chest pain. EXAM: PORTABLE CHEST 1 VIEW COMPARISON:  CT kidney 08/18/2020 FINDINGS: The heart size and mediastinal contours are within normal limits. Trace biapical pleural/pulmonary scarring. No focal consolidation. No pulmonary edema. No pleural effusion. No pneumothorax. No acute osseous abnormality. IMPRESSION: No active disease. Electronically Signed   By: Tish Frederickson M.D.   On: 07/06/2021 23:48    Procedures Procedures   Medications Ordered in ED Medications  alum & mag hydroxide-simeth (MAALOX/MYLANTA) 200-200-20 MG/5ML suspension 30 mL (30 mLs Oral Given 07/06/21 2326)    ED Course  I have reviewed the triage vital signs and the nursing notes.  Pertinent labs & imaging results that were available during my care of the patient were reviewed by me and considered in my medical decision making (see chart for details).  I suspect this is a anxiety but family stated some congestion as well so a covid swab was sent.  Well appearing, vital and imaging are unremarkable as is exam.  Stable for discharge with close follow up.    Caleb Glover was evaluated in Emergency Department on 07/07/2021 for the symptoms described in  the history of present illness. He was evaluated in the context of the global COVID-19 pandemic, which necessitated consideration that the patient might be at risk for infection with the SARS-CoV-2 virus that causes COVID-19. Institutional protocols and algorithms that pertain to the evaluation of patients at risk for COVID-19 are in a state of rapid change based on information released by regulatory bodies including the CDC and federal and state organizations. These policies and algorithms were followed during the patient's care in the ED.     Final Clinical Impression(s) / ED Diagnoses Final diagnoses:  None     Return for intractable cough, coughing up blood,  fevers > 100.4 unrelieved by medication, shortness of breath, intractable vomiting, chest pain, shortness of breath, weakness, numbness, changes in speech, facial asymmetry, abdominal pain, passing out, Inability to tolerate liquids or food, cough, altered mental status or any concerns. No signs of systemic illness or infection. The patient is nontoxic-appearing on exam and vital signs are within normal limits. I have reviewed the triage vital signs and the nursing notes. Pertinent labs & imaging results that were available during my care of the patient were reviewed by me and considered in my medical decision making (see chart for details). After history, exam, and medical workup I feel the patient has been appropriately medically screened and is safe for discharge home. Pertinent diagnoses were discussed with the patient. Patient was given return precautions.  Rx / DC Orders ED Discharge Orders     None        Graceson Nichelson, MD 07/07/21 0160

## 2021-10-11 ENCOUNTER — Other Ambulatory Visit: Payer: Self-pay

## 2021-10-11 ENCOUNTER — Emergency Department (HOSPITAL_BASED_OUTPATIENT_CLINIC_OR_DEPARTMENT_OTHER)
Admission: EM | Admit: 2021-10-11 | Discharge: 2021-10-11 | Disposition: A | Discharge status: Home / Self Care | Payer: Medicaid Other | Attending: Emergency Medicine | Admitting: Emergency Medicine

## 2021-10-11 ENCOUNTER — Encounter (HOSPITAL_BASED_OUTPATIENT_CLINIC_OR_DEPARTMENT_OTHER): Payer: Self-pay | Admitting: *Deleted

## 2021-10-11 DIAGNOSIS — K219 Gastro-esophageal reflux disease without esophagitis: Secondary | ICD-10-CM | POA: Diagnosis not present

## 2021-10-11 DIAGNOSIS — R079 Chest pain, unspecified: Secondary | ICD-10-CM | POA: Insufficient documentation

## 2021-10-11 DIAGNOSIS — F410 Panic disorder [episodic paroxysmal anxiety] without agoraphobia: Secondary | ICD-10-CM | POA: Insufficient documentation

## 2021-10-11 DIAGNOSIS — M25512 Pain in left shoulder: Secondary | ICD-10-CM | POA: Insufficient documentation

## 2021-10-11 MED ORDER — OMEPRAZOLE 20 MG PO CPDR: 20.0000 mg | DELAYED_RELEASE_CAPSULE | Freq: Every day | ORAL | 0 refills | Status: DC

## 2021-10-11 MED ORDER — ALUM & MAG HYDROXIDE-SIMETH 200-200-20 MG/5ML PO SUSP
30.0000 mL | Freq: Once | ORAL | Status: AC
  Administered 2021-10-11: 30 mL via ORAL
  Filled 2021-10-11: qty 30

## 2021-10-11 NOTE — ED Triage Notes (Signed)
Panic attack. States ate spicy food tonight and had indigestion and a gas bubble.

## 2021-10-11 NOTE — ED Provider Notes (Signed)
[facility] EMERGENCY DEPARTMENT Provider Note   CSN: [id number] Arrival date & time: 10/11/21  2049     History No chief complaint on file.   Caleb Glover is a 20 y.o. male.  Patient with no past medical history presents today with chief complaint of panic attack and reflux.  Patient states that earlier today he was eating spicy food, suddenly felt pain and burning sensation in the middle of his chest and into his mouth which triggered a panic attack.  Patient has history of reflux as well as panic attacks.  Has been treated with reflux medication in the past which has helped his symptoms.  Denies fevers, chills, chest pain, shortness of breath, abdominal pain, nausea, vomiting, diarrhea.  Symptoms occurred for approximately 5 minutes and then resolved, patient is now only complaining of some left shoulder tightness.  Denies injury. No history of cardiac problems, no family history of sudden cardiac death or cardiac disease at young age. Patient denies smoking, drinking alcohol, or illicit drug use.  The history is provided by the patient. No language interpreter was used.      History reviewed. No pertinent past medical history.  There are no problems to display for this patient.   Past Surgical History:  Procedure Laterality Date   WISDOM TOOTH EXTRACTION         No family history on file.  Social History   Tobacco Use   Smoking status: Never   Smokeless tobacco: Never  Vaping Use   Vaping Use: Never used  Substance Use Topics   Alcohol use: No   Drug use: No    Home Medications Prior to Admission medications   Medication Sig Start Date End Date Taking? Authorizing Provider  doxycycline (VIBRAMYCIN) 100 MG capsule Take 1 capsule (100 mg total) by mouth 2 (two) times daily. 05/21/21   Terrilee Files, MD  omeprazole (PRILOSEC) 20 MG capsule Take 1 capsule (20 mg total) by mouth daily. 11/19/17   Maczis, Elmer Sow, PA-C    Allergies     Patient has no known allergies.  Review of Systems   Review of Systems  Constitutional:  Negative for chills and fever.  Respiratory:  Negative for cough, shortness of breath, wheezing and stridor.   Cardiovascular:  Negative for chest pain, palpitations and leg swelling.  Gastrointestinal:  Negative for abdominal distention, abdominal pain, constipation, diarrhea, nausea and vomiting.  Skin:  Negative for pallor, rash and wound.  Neurological:  Negative for dizziness, tremors, seizures, syncope, facial asymmetry, speech difficulty, weakness, light-headedness, numbness and headaches.  Psychiatric/Behavioral:  Negative for confusion and decreased concentration.   All other systems reviewed and are negative.  Physical Exam Updated Vital Signs BP 122/77 (BP Location: Right Arm)   Pulse 76   Temp 98.2 F (36.8 C) (Oral)   Resp 16   Ht 5\' 6"  (1.676 m)   Wt 49.9 kg   SpO2 100%   BMI 17.76 kg/m   Physical Exam Vitals and nursing note reviewed.  Constitutional:      General: He is not in acute distress.    Appearance: Normal appearance. He is normal weight. He is not ill-appearing, toxic-appearing or diaphoretic.  HENT:     Head: Normocephalic and atraumatic.     Mouth/Throat:     Mouth: Mucous membranes are moist.     Pharynx: Oropharynx is clear. No oropharyngeal exudate or posterior oropharyngeal erythema.  Eyes:     General: No scleral icterus.    Conjunctiva/sclera:  Conjunctivae normal.     Pupils: Pupils are equal, round, and reactive to light.  Cardiovascular:     Rate and Rhythm: Normal rate and regular rhythm.     Pulses: Normal pulses.     Heart sounds: Normal heart sounds.  Pulmonary:     Effort: Pulmonary effort is normal. No respiratory distress.     Breath sounds: Normal breath sounds. No stridor. No wheezing, rhonchi or rales.     Comments: Lungs clear to auscultation in all fields Chest:     Chest wall: No tenderness.  Abdominal:     General: Abdomen is  flat. Bowel sounds are normal. There is no distension.     Palpations: Abdomen is soft. There is no mass.     Tenderness: There is no abdominal tenderness. There is no right CVA tenderness, left CVA tenderness, guarding or rebound.     Hernia: No hernia is present.  Musculoskeletal:        General: Normal range of motion.     Cervical back: Normal range of motion and neck supple.     Comments: Tenderness and muscular tightness noted to left shoulder area without bruising, warmth, or erythema. No bony tenderness. Full ROM  Skin:    General: Skin is warm and dry.  Neurological:     General: No focal deficit present.     Mental Status: He is alert.  Psychiatric:        Mood and Affect: Mood normal.        Behavior: Behavior normal.    ED Results / Procedures / Treatments   Labs (all labs ordered are listed, but only abnormal results are displayed) Labs Reviewed - No data to display  EKG EKG Interpretation  Date/Time:  Thursday October 11 2021 22:03:04 EDT Ventricular Rate:  81 PR Interval:  149 QRS Duration: 93 QT Interval:  366 QTC Calculation: 425 R Axis:   82 Text Interpretation: Sinus rhythm Ventricular premature complex RSR' in V1 or V2, right VCD or RVH Confirmed by Ernie Avena ([id number]) on 10/11/2021 10:08:49 PM  Radiology No results found.  Procedures Procedures   Medications Ordered in ED Medications  alum & mag hydroxide-simeth (MAALOX/MYLANTA) 200-200-20 MG/5ML suspension 30 mL (has no administration in time range)    ED Course  I have reviewed the triage vital signs and the nursing notes.  Pertinent labs & imaging results that were available during my care of the patient were reviewed by me and considered in my medical decision making (see chart for details).    MDM Rules/Calculators/A&P                         Patient presents today following an episode of burning sensation in right chest area and into his mouth after eating spicy foods.  This sensation  seems to have triggered a panic attack which lasted approximately 5 minutes.  All symptoms have now subsided save for some mild left shoulder pain.  Patient has history of anxiety with panic attacks and states that symptoms this time were baseline for his panic attacks.  EKG obtained which was normal sinus rhythm and unchanged from EKG obtained in July.  Patient asymptomatic at this time, low suspicion for ACS.  No shortness of breath, lungs clear to auscultation in all fields, oxygen saturation maintained at 100% on room air throughout visit, low suspicion for spontaneous pneumothorax or other lung abnormality at this time. No further labs or imaging indicated at  this time. Patient is afebrile, non-toxic appearing, and in no acute distress with normal vital signs.  Patient with history of reflux, Maalox suspension given in the ER with relief.  Patient denies any further burning sensation, chest pain, or shortness of breath.  No abdominal tenderness on thorough exam, low suspicion for acute intra-abdominal process.  Feels symptoms can be explained by reflux in the setting of postprandial burning and history of reflux.  Will treat with short course PPI and close PCP follow-up for further management.  Patient is amenable with this plan, all questions answered, educated on red flag symptoms that would prompt immediate return  Patient also endorsing left shoulder pain, muscular tightness and tenderness noted on palpation of trapezius muscle area.  No bony tenderness, patient denies injury, no imaging indicated at this time.  Feel this is likely unrelated pain, doubt cardiac cause at this time.  Full range of motion to affected joints, will recommend stretching and heat/ice as needed.  Patient also with longstanding history of anxiety and panic attacks, patient clearly anxious on exam today with many questions about further management of his care.  All questions have been answered, and patient is amenable with plan  of discharge and GERD treatment, however feel that further evaluation by PCP of anxiety is warranted.  Patient is in agreement and is discharged in stable condition.  Findings and plan of care discussed with supervising physician Dr. Karene Fry who is in agreement.     Final Clinical Impression(s) / ED Diagnoses Final diagnoses:  Gastroesophageal reflux disease, unspecified whether esophagitis present    Rx / DC Orders ED Discharge Orders          Ordered    omeprazole (PRILOSEC) 20 MG capsule  Daily        10/11/21 2231          An After Visit Summary was printed and given to the patient.    Vear Clock 10/12/21 Ollen Bowl    Ernie Avena, MD 10/12/21 1325

## 2021-10-11 NOTE — Discharge Instructions (Signed)
Your EKG was unremarkable for acute changes today. I feel that your symptoms today are likely due to gastric reflux. I have written a prescription for Prilosec for further management of these symptoms. Please take this as prescribed and follow-up with your primary care provider in the next few days for further management of your symptoms. Return if symptoms worsen.

## 2021-11-01 ENCOUNTER — Encounter (HOSPITAL_BASED_OUTPATIENT_CLINIC_OR_DEPARTMENT_OTHER): Payer: Self-pay

## 2021-11-01 ENCOUNTER — Other Ambulatory Visit: Payer: Self-pay

## 2021-11-01 ENCOUNTER — Emergency Department (HOSPITAL_BASED_OUTPATIENT_CLINIC_OR_DEPARTMENT_OTHER)
Admission: EM | Admit: 2021-11-01 | Discharge: 2021-11-01 | Disposition: A | Discharge status: Home / Self Care | Payer: Medicaid Other | Attending: Emergency Medicine | Admitting: Emergency Medicine

## 2021-11-01 DIAGNOSIS — R350 Frequency of micturition: Secondary | ICD-10-CM

## 2021-11-01 DIAGNOSIS — R300 Dysuria: Secondary | ICD-10-CM | POA: Insufficient documentation

## 2021-11-01 LAB — URINALYSIS, ROUTINE W REFLEX MICROSCOPIC
Bilirubin Urine: NEGATIVE
Glucose, UA: NEGATIVE mg/dL
Hgb urine dipstick: NEGATIVE
Ketones, ur: 5 mg/dL — AB
Leukocytes,Ua: NEGATIVE
Nitrite: NEGATIVE
Protein, ur: NEGATIVE mg/dL
Specific Gravity, Urine: 1.025 (ref 1.005–1.030)
pH: 7.5 (ref 5.0–8.0)

## 2021-11-01 NOTE — Discharge Instructions (Addendum)
Please refrain from sexual intercourse until you receive the results of your gonorrhea/chlamydia test.  This should be available on MyChart in the next 2 to 3 days.  If you develop any new or worsening symptoms please come back to the emergency department immediately for reevaluation.  It was a pleasure to meet you.

## 2021-11-01 NOTE — ED Triage Notes (Signed)
Pt c/o urinary freq x 3 days-denies dysuria and penile d/c-NAD-steady gait

## 2021-11-01 NOTE — ED Provider Notes (Signed)
[facility] EMERGENCY DEPARTMENT Provider Note   CSN: [id number] Arrival date & time: 11/01/21  1959     History Chief Complaint  Patient presents with   Urinary Frequency    Caleb Glover is a 20 y.o. male.  HPI Patient is a 20 year old male who presents to the emergency department due to urinary frequency for the past few days.  States he initially had some mild dysuria as well as lower abdominal cramping but this has since resolved.  No current abdominal pain.  No back pain.  No penile discharge, rashes, penile pain, testicular pain.    History reviewed. No pertinent past medical history.  There are no problems to display for this patient.   Past Surgical History:  Procedure Laterality Date   WISDOM TOOTH EXTRACTION         No family history on file.  Social History   Tobacco Use   Smoking status: Never   Smokeless tobacco: Never  Vaping Use   Vaping Use: Never used  Substance Use Topics   Alcohol use: No   Drug use: No    Home Medications Prior to Admission medications   Medication Sig Start Date End Date Taking? Authorizing Provider  doxycycline (VIBRAMYCIN) 100 MG capsule Take 1 capsule (100 mg total) by mouth 2 (two) times daily. 05/21/21   Terrilee Files, MD  omeprazole (PRILOSEC) 20 MG capsule Take 1 capsule (20 mg total) by mouth daily for 10 days. 10/11/21 10/21/21  Smoot, Shawn Route, PA-C    Allergies    Benadryl [diphenhydramine]  Review of Systems   Review of Systems  Gastrointestinal:  Negative for abdominal pain.  Endocrine: Negative for polydipsia.  Genitourinary:  Positive for dysuria and frequency. Negative for decreased urine volume, penile discharge and penile pain.   Physical Exam Updated Vital Signs BP 117/73 (BP Location: Left Arm)   Pulse 89   Temp 98.2 F (36.8 C) (Oral)   Resp 16   Ht 5\' 6"  (1.676 m)   Wt 49.9 kg   SpO2 100%   BMI 17.75 kg/m   Physical Exam Vitals and nursing note reviewed.   Constitutional:      General: He is not in acute distress.    Appearance: Normal appearance. He is not ill-appearing, toxic-appearing or diaphoretic.  HENT:     Head: Normocephalic and atraumatic.     Right Ear: External ear normal.     Left Ear: External ear normal.     Nose: Nose normal.     Mouth/Throat:     Mouth: Mucous membranes are moist.     Pharynx: Oropharynx is clear. No oropharyngeal exudate or posterior oropharyngeal erythema.  Eyes:     Extraocular Movements: Extraocular movements intact.  Cardiovascular:     Rate and Rhythm: Normal rate.     Pulses: Normal pulses.  Pulmonary:     Effort: Pulmonary effort is normal. No respiratory distress.     Breath sounds: No stridor.  Abdominal:     General: Abdomen is flat.     Palpations: Abdomen is soft.     Tenderness: There is no abdominal tenderness.     Comments: Abdomen is flat, soft, and nontender.  No CVA tenderness.  Musculoskeletal:        General: Normal range of motion.     Cervical back: Normal range of motion and neck supple. No tenderness.  Skin:    General: Skin is warm and dry.  Neurological:     General: No  focal deficit present.     Mental Status: He is alert and oriented to person, place, and time.  Psychiatric:        Mood and Affect: Mood normal.        Behavior: Behavior normal.   ED Results / Procedures / Treatments   Labs (all labs ordered are listed, but only abnormal results are displayed) Labs Reviewed  URINALYSIS, ROUTINE W REFLEX MICROSCOPIC - Abnormal; Notable for the following components:      Result Value   APPearance CLOUDY (*)    Ketones, ur 5 (*)    All other components within normal limits  GC/CHLAMYDIA PROBE AMP ([place]) NOT AT [facility]   EKG None  Radiology No results found.  Procedures Procedures   Medications Ordered in ED Medications - No data to display  ED Course  I have reviewed the triage vital signs and the nursing notes.  Pertinent labs & imaging  results that were available during my care of the patient were reviewed by me and considered in my medical decision making (see chart for details).    MDM Rules/Calculators/A&P                          Patient is a 20 year old male who presents to the emergency department due to urinary frequency for the past 3 days.  Currently denies any other complaints.  On my exam abdomen is soft and nontender.  No CVA tenderness.  Patient denies any penile discharge or other GU complaints.  UA obtained in triage which shows 5 ketones but is otherwise reassuring.  Does not appear infectious.  Feel that the patient is stable for discharge at this time and he is agreeable.  States he is in a monogamous relationship and denies any symptoms associated with STI but requested a gonorrhea/chlamydia test.  We will have this obtained.  Patient is going to check the results on MyChart.  Discussed return precautions.  His questions were answered and he was amicable at the time of discharge.  Final Clinical Impression(s) / ED Diagnoses Final diagnoses:  Urinary frequency   Rx / DC Orders ED Discharge Orders     None        Placido Sou, PA-C 11/01/21 2301    Arby Barrette, MD 11/06/21 1054

## 2021-11-05 LAB — GC/CHLAMYDIA PROBE AMP (~~LOC~~) NOT AT ARMC
Chlamydia: NEGATIVE
Comment: NEGATIVE
Comment: NORMAL
Neisseria Gonorrhea: NEGATIVE

## 2022-03-20 ENCOUNTER — Other Ambulatory Visit: Payer: Self-pay

## 2022-03-20 ENCOUNTER — Encounter (HOSPITAL_BASED_OUTPATIENT_CLINIC_OR_DEPARTMENT_OTHER): Payer: Self-pay

## 2022-03-20 ENCOUNTER — Emergency Department (HOSPITAL_BASED_OUTPATIENT_CLINIC_OR_DEPARTMENT_OTHER)
Admission: EM | Admit: 2022-03-20 | Discharge: 2022-03-20 | Disposition: A | Discharge status: Home / Self Care | Payer: Medicaid Other | Attending: Emergency Medicine | Admitting: Emergency Medicine

## 2022-03-20 DIAGNOSIS — N4889 Other specified disorders of penis: Secondary | ICD-10-CM | POA: Insufficient documentation

## 2022-03-20 DIAGNOSIS — R300 Dysuria: Secondary | ICD-10-CM | POA: Insufficient documentation

## 2022-03-20 DIAGNOSIS — Z711 Person with feared health complaint in whom no diagnosis is made: Secondary | ICD-10-CM

## 2022-03-20 DIAGNOSIS — Z202 Contact with and (suspected) exposure to infections with a predominantly sexual mode of transmission: Secondary | ICD-10-CM | POA: Insufficient documentation

## 2022-03-20 LAB — URINALYSIS, ROUTINE W REFLEX MICROSCOPIC
Bilirubin Urine: NEGATIVE
Glucose, UA: NEGATIVE mg/dL
Hgb urine dipstick: NEGATIVE
Ketones, ur: NEGATIVE mg/dL
Leukocytes,Ua: NEGATIVE
Nitrite: NEGATIVE
Protein, ur: NEGATIVE mg/dL
Specific Gravity, Urine: 1.02 (ref 1.005–1.030)
pH: 8.5 — ABNORMAL HIGH (ref 5.0–8.0)

## 2022-03-20 LAB — HIV ANTIBODY (ROUTINE TESTING W REFLEX): HIV Screen 4th Generation wRfx: NONREACTIVE

## 2022-03-20 MED ORDER — DOXYCYCLINE HYCLATE 100 MG PO CAPS: 100.0000 mg | ORAL_CAPSULE | Freq: Two times a day (BID) | ORAL | 0 refills | Status: AC

## 2022-03-20 MED ORDER — CEFTRIAXONE SODIUM 500 MG IJ SOLR
500.0000 mg | Freq: Once | INTRAMUSCULAR | Status: AC
  Administered 2022-03-20: 500 mg via INTRAMUSCULAR
  Filled 2022-03-20: qty 500

## 2022-03-20 NOTE — ED Triage Notes (Signed)
C/o burning while urination, also c/o bump on penis. Denies discharge. ?

## 2022-03-20 NOTE — Discharge Instructions (Addendum)
We will call you with the results of your testing when it is available. ?Follow-up with your primary care provider. ?Take antibiotics as prescribed ?Return to the ER if you start to experience worsening symptoms, testicular pain or swelling, severe abdominal pain, uncontrollable vomiting ?

## 2022-03-20 NOTE — ED Provider Notes (Signed)
?[facility] EMERGENCY DEPARTMENT ?Provider Note ? ? ?CSN: [id number] ?Arrival date & time: 03/20/22  1042 ? ?  ? ?History ? ?Chief Complaint  ?Patient presents with  ? Dysuria  ? ? ?Caleb Glover is a 21 y.o. male presenting to the ED with a chief complaint of dysuria and lesion on penis.  Reports unprotected intercourse and is concerned about STDs.  He denies any penile discharge, testicular pain or swelling, abdominal pain or fever.  States that this lesion is not painful.  He noticed lesion yesterday ? ? ?Dysuria ?Presenting symptoms: dysuria   ?Associated symptoms: no fever, no penile swelling and no scrotal swelling   ? ?  ? ?Home Medications ?Prior to Admission medications   ?Medication Sig Start Date End Date Taking? Authorizing Provider  ?doxycycline (VIBRAMYCIN) 100 MG capsule Take 1 capsule (100 mg total) by mouth 2 (two) times daily for 7 days. 03/20/22 03/27/22 Yes Lesieli Bresee, PA-C  ?omeprazole (PRILOSEC) 20 MG capsule Take 1 capsule (20 mg total) by mouth daily for 10 days. 10/11/21 10/21/21  Smoot, Shawn Route, PA-C  ?   ? ?Allergies    ?Peach [prunus persica], Shrimp extract allergy skin test, and Benadryl [diphenhydramine]   ? ?Review of Systems   ?Review of Systems  ?Constitutional:  Negative for fever.  ?Genitourinary:  Positive for dysuria and genital sores. Negative for penile swelling, scrotal swelling and testicular pain.  ? ?Physical Exam ?Updated Vital Signs ?BP 115/79 (BP Location: Left Arm)   Pulse 87   Temp 97.9 ?F (36.6 ?C) (Oral)   Resp 18   Ht 5\' 6"  (1.676 m)   Wt 49.9 kg   SpO2 100%   BMI 17.75 kg/m?  ?Physical Exam ?Vitals and nursing note reviewed. Exam conducted with a chaperone present.  ?Constitutional:   ?   General: He is not in acute distress. ?   Appearance: He is well-developed. He is not diaphoretic.  ?HENT:  ?   Head: Normocephalic and atraumatic.  ?Eyes:  ?   General: No scleral icterus. ?   Conjunctiva/sclera: Conjunctivae normal.  ?Pulmonary:  ?    Effort: Pulmonary effort is normal. No respiratory distress.  ?Genitourinary: ?   Comments: Chaperone present for exam.  There is 1 very small pustule like area on the penis although this is not tender to palpation and does not appear vesicular.  No testicle swelling or tenderness. ?Musculoskeletal:  ?   Cervical back: Normal range of motion.  ?Skin: ?   Findings: No rash.  ?Neurological:  ?   Mental Status: He is alert.  ? ? ?ED Results / Procedures / Treatments   ?Labs ?(all labs ordered are listed, but only abnormal results are displayed) ?Labs Reviewed  ?URINALYSIS, ROUTINE W REFLEX MICROSCOPIC - Abnormal; Notable for the following components:  ?    Result Value  ? pH 8.5 (*)   ? All other components within normal limits  ?RAPID HIV SCREEN (HIV 1/2 AB+AG)  ?RPR  ?GC/CHLAMYDIA PROBE AMP ([place]) NOT AT [facility]  ? ? ?EKG ?None ? ?Radiology ?No results found. ? ?Procedures ?Procedures  ? ? ?Medications Ordered in ED ?Medications  ?cefTRIAXone (ROCEPHIN) injection 500 mg (has no administration in time range)  ? ? ?ED Course/ Medical Decision Making/ A&P ?  ?                        ?Medical Decision Making ?Amount and/or Complexity of Data Reviewed ?Labs: ordered. ? ? ?  21 year old male presenting to the ED for dysuria and lesion on penis.  He is concerned about STDs and would like to be tested and treated.  Physical exam findings shows 1 very small pustular like area on the penis.  This does not appear vesicular, is not tender and there is no chancre present.  I do not believe that this represents an STD but he is being tested.  I have low suspicion that this is herpes.  He would like to be treated prior to results.  His urinalysis is negative for infection.  We will treat for gonorrhea and chlamydia and await remainder of lab work.  Patient is hemodynamically stable at this time.  Return precautions given ? ?Patient is hemodynamically stable, in NAD, and able to ambulate in the ED. Evaluation does not show  pathology that would require ongoing emergent intervention or inpatient treatment. I explained the diagnosis to the patient. Pain has been managed and has no complaints prior to discharge. Patient is comfortable with above plan and is stable for discharge at this time. All questions were answered prior to disposition. Strict return precautions for returning to the ED were discussed. Encouraged follow up with PCP.  ? ?An After Visit Summary was printed and given to the patient. ? ? ?Portions of this note were generated with Scientist, clinical (histocompatibility and immunogenetics). Dictation errors may occur despite best attempts at proofreading. ? ? ? ? ? ? ? ? ?Final Clinical Impression(s) / ED Diagnoses ?Final diagnoses:  ?Concern about STD in male without diagnosis  ? ? ?Rx / DC Orders ?ED Discharge Orders   ? ?      Ordered  ?  doxycycline (VIBRAMYCIN) 100 MG capsule  2 times daily       ? 03/20/22 1200  ? ?  ?  ? ?  ? ? ?  Dietrich Pates, PA-C ?03/20/22 1202 ? ?  ?Vanetta Mulders, MD ?03/25/22 [phone] ? ?

## 2022-03-21 LAB — GC/CHLAMYDIA PROBE AMP (~~LOC~~) NOT AT ARMC
Chlamydia: NEGATIVE
Comment: NEGATIVE
Comment: NORMAL
Neisseria Gonorrhea: NEGATIVE

## 2022-03-21 LAB — RPR: RPR Ser Ql: NONREACTIVE

## 2022-06-05 ENCOUNTER — Emergency Department (HOSPITAL_BASED_OUTPATIENT_CLINIC_OR_DEPARTMENT_OTHER): Payer: Medicaid Other

## 2022-06-05 ENCOUNTER — Other Ambulatory Visit: Payer: Self-pay

## 2022-06-05 ENCOUNTER — Encounter (HOSPITAL_BASED_OUTPATIENT_CLINIC_OR_DEPARTMENT_OTHER): Payer: Self-pay | Admitting: Emergency Medicine

## 2022-06-05 ENCOUNTER — Emergency Department (HOSPITAL_BASED_OUTPATIENT_CLINIC_OR_DEPARTMENT_OTHER)
Admission: EM | Admit: 2022-06-05 | Discharge: 2022-06-05 | Disposition: A | Discharge status: Home / Self Care | Payer: Medicaid Other | Attending: Emergency Medicine | Admitting: Emergency Medicine

## 2022-06-05 DIAGNOSIS — W228XXA Striking against or struck by other objects, initial encounter: Secondary | ICD-10-CM | POA: Diagnosis not present

## 2022-06-05 DIAGNOSIS — S81812A Laceration without foreign body, left lower leg, initial encounter: Secondary | ICD-10-CM | POA: Insufficient documentation

## 2022-06-05 DIAGNOSIS — S6992XA Unspecified injury of left wrist, hand and finger(s), initial encounter: Secondary | ICD-10-CM | POA: Diagnosis present

## 2022-06-05 HISTORY — DX: Vitamin D deficiency, unspecified: E55.9

## 2022-06-05 MED ORDER — LIDOCAINE-EPINEPHRINE (PF) 2 %-1:200000 IJ SOLN
10.0000 mL | Freq: Once | INTRAMUSCULAR | Status: AC
  Administered 2022-06-05: 10 mL
  Filled 2022-06-05: qty 20

## 2022-06-05 MED ORDER — TETANUS-DIPHTH-ACELL PERTUSSIS 5-2.5-18.5 LF-MCG/0.5 IM SUSY
0.5000 mL | PREFILLED_SYRINGE | Freq: Once | INTRAMUSCULAR | Status: DC
  Filled 2022-06-05: qty 0.5

## 2022-06-05 NOTE — Discharge Instructions (Addendum)
It was a pleasure taking care of you today!   You may return to urgent care or return to the emergency department for suture removal in 7-10 days.  Keep the area clean and dry.  You may take 500 mg Tylenol every 6 hours as needed for pain.  Return to the emergency department if worsening or persistent pain, drainage of wound, increased swelling, or color change to area.

## 2022-06-05 NOTE — ED Triage Notes (Signed)
Pt c/o injury to LLE when a stand-on mower he was on hit something and then jerked back onto him; reports lac, dressing in place

## 2022-06-05 NOTE — ED Provider Notes (Signed)
[facility] EMERGENCY DEPARTMENT Provider Note   CSN: [id number] Arrival date & time: 06/05/22  0944     History  Chief Complaint  Patient presents with   Leg Injury    Caleb Glover is a 21 y.o. male who presents to the emergency department with concerns for left lower extremity leg injury onset prior to arrival.  Patient was on a stand on the OR when he hit a metal pole that struck him in his left lower leg.  Denies anticoagulant use at this time.  No meds tried prior to arrival.  Denies ankle pain, knee pain, fever, chills.   The history is provided by the patient. No language interpreter was used.      Home Medications Prior to Admission medications   Medication Sig Start Date End Date Taking? Authorizing Provider  omeprazole (PRILOSEC) 20 MG capsule Take 1 capsule (20 mg total) by mouth daily for 10 days. 10/11/21 10/21/21  Smoot, Shawn RouteSarah A, PA-C      Allergies    Peach [prunus persica], Shrimp extract allergy skin test, and Benadryl [diphenhydramine]    Review of Systems   Review of Systems  Constitutional:  Negative for chills and fever.  Musculoskeletal:  Positive for arthralgias. Negative for joint swelling.  Skin:  Positive for wound. Negative for color change.  Neurological:  Negative for weakness and numbness.  All other systems reviewed and are negative.  Physical Exam Updated Vital Signs BP 108/67   Pulse 77   Temp 98.5 F (36.9 C) (Oral)   Resp 16   Ht 5\' 7"  (1.702 m)   Wt 49 kg   SpO2 100%   BMI 16.92 kg/m  Physical Exam Vitals and nursing note reviewed.  Constitutional:      General: He is not in acute distress.    Appearance: Normal appearance. He is not ill-appearing.  HENT:     Head: Normocephalic and atraumatic.     Right Ear: External ear normal.     Left Ear: External ear normal.  Eyes:     General: No scleral icterus. Cardiovascular:     Rate and Rhythm: Normal rate.  Pulmonary:     Effort: Pulmonary effort is  normal.  Musculoskeletal:        General: Normal range of motion.     Cervical back: Normal range of motion and neck supple.     Comments: Pedal pulses intact bilaterally.  No tenderness to palpation noted to the left ankle or left knee.  Skin:    General: Skin is warm and dry.     Capillary Refill: Capillary refill takes less than 2 seconds.     Findings: Laceration present.     Comments: 2 cm laceration noted to left lower leg.  No surrounding erythema.  Neurological:     Mental Status: He is alert.    ED Results / Procedures / Treatments   Labs (all labs ordered are listed, but only abnormal results are displayed) Labs Reviewed - No data to display  EKG None  Radiology DG Tibia/Fibula Left  Result Date: 06/05/2022 CLINICAL DATA:  Injury to left lower leg by mower with laceration to anterior lower leg. EXAM: LEFT TIBIA AND FIBULA - 2 VIEW COMPARISON:  None Available. FINDINGS: No fracture identified. No evidence of soft tissue foreign body. No bony lesions or arthropathy. IMPRESSION: Negative. Electronically Signed   By: Irish LackGlenn  Yamagata M.D.   On: 06/05/2022 11:45    Procedures .Marland Kitchen.Laceration Repair  Date/Time: 06/05/2022 10:55  AM Performed by: Karenann Cai, PA-C Authorized by: Karenann Cai, PA-C   Consent:    Consent obtained:  Verbal   Consent given by:  Patient   Risks discussed:  Infection and pain Universal protocol:    Patient identity confirmed:  Verbally with patient and hospital-assigned identification number Anesthesia:    Anesthesia method:  Local infiltration   Local anesthetic:  Lidocaine 2% WITH epi Laceration details:    Location:  Leg   Leg location:  L lower leg   Length (cm):  2 Pre-procedure details:    Preparation:  Patient was prepped and draped in usual sterile fashion Exploration:    Hemostasis achieved with:  Epinephrine and direct pressure   Imaging outcome: foreign body not noted     Wound exploration: entire depth of wound visualized    Treatment:    Area cleansed with:  Saline   Amount of cleaning:  Standard   Irrigation solution:  Sterile saline   Irrigation method:  Syringe Skin repair:    Repair method:  Sutures   Suture material:  Prolene and chromic gut (1 prolene, 3 chromic gut)   Suture technique:  Simple interrupted   Number of sutures:  4 Approximation:    Approximation:  Close Repair type:    Repair type:  Simple Post-procedure details:    Dressing:  Non-adherent dressing and sterile dressing   Procedure completion:  Tolerated well, no immediate complications    Medications Ordered in ED Medications  lidocaine-EPINEPHrine (XYLOCAINE W/EPI) 2 %-1:200000 (PF) injection 10 mL (10 mLs Infiltration Given by Other 06/05/22 1050)    ED Course/ Medical Decision Making/ A&P                           Medical Decision Making Risk Prescription drug management.   Patient presents with laceration noted to left lower leg onset prior to arrival. Pt is not on anticoagulants at this time. Vital signs, stable, patient afebrile, not tachycardic or hypoxic. On exam, patient with 1 cm laceration noted to left lower leg without surrounding erythema.  No tenderness to palpation noted to left ankle or left knee.  Pedal pulses intact bilaterally. Tetanus up-to-date and completed in August 2020.  Laceration occurred < 12 hours prior to repair. Differential diagnosis includes, fracture, foreign body, dislocation, avulsion.    Medications:  I ordered medication including tetanus vaccine for prophylaxis I have reviewed the patients home medicines and have made adjustments as needed    Disposition: Presentation suspicious for laceration. Doubt fracture, dislocation, or foreign body at this time. Tetanus up-to-date. Wound thoroughly irrigated, no foreign bodies noted. Laceration repaired in the ED today. After consideration of the diagnostic results and the patients response to treatment, I feel that the patient would benefit  from Discharge home. Discussed laceration care with pt and answered questions. Pt to follow up for suture removal in 7-10 days and wound check sooner should there be signs of dehiscence or infection. Pt is hemodynamically stable with no complaints prior to discharge. Supportive care measures and strict return precautions discussed with patient at bedside. Pt acknowledges and verbalizes understanding. Pt appears safe for discharge. Follow up as indicated in discharge paperwork.    This chart was dictated using voice recognition software, Dragon. Despite the best efforts of this provider to proofread and correct errors, errors may still occur which can change documentation meaning.  Final Clinical Impression(s) / ED Diagnoses Final diagnoses:  Laceration of left  lower extremity, initial encounter    Rx / DC Orders ED Discharge Orders     None         Connelly Spruell A, PA-C 06/05/22 1226    Jacalyn Lefevre, MD 06/06/22 [phone]

## 2022-06-05 NOTE — ED Notes (Signed)
Sterile dressing applied to left lower extremity. Non-adherent pad followed by gauze wrap to keep in place.  Pt tolerated well.

## 2022-08-29 ENCOUNTER — Encounter (HOSPITAL_BASED_OUTPATIENT_CLINIC_OR_DEPARTMENT_OTHER): Payer: Self-pay | Admitting: Emergency Medicine

## 2022-08-29 ENCOUNTER — Emergency Department (HOSPITAL_BASED_OUTPATIENT_CLINIC_OR_DEPARTMENT_OTHER)
Admission: EM | Admit: 2022-08-29 | Discharge: 2022-08-29 | Disposition: A | Discharge status: Home / Self Care | Payer: Medicaid Other | Attending: Emergency Medicine | Admitting: Emergency Medicine

## 2022-08-29 ENCOUNTER — Other Ambulatory Visit: Payer: Self-pay

## 2022-08-29 DIAGNOSIS — M7989 Other specified soft tissue disorders: Secondary | ICD-10-CM | POA: Diagnosis present

## 2022-08-29 NOTE — ED Notes (Signed)
Patient states he has left hand swelling. States he was weed eating today for quite some time. Swelling has an onset of 2 hours. Patient states he is not sure where the swelling is coming from and does not remember being stung/bit or anything flying off of the weed eater head and striking his hand. CMS present and cap refill is less than 3 seconds in affected hand.

## 2022-08-29 NOTE — ED Provider Notes (Signed)
[facility] EMERGENCY DEPARTMENT Provider Note   CSN: [id number] Arrival date & time: 08/29/22  2054     History  Chief Complaint  Patient presents with   Hand Problem    LEFT    Caleb Glover is a 21 y.o. male.  Patient presents emergency department today for evaluation of left hand swelling.  Symptoms started a couple of hours ago.  He noticed the swelling to the dorsum of the hand after finishing string trimming this evening.  He did not feel an insect sting or bite.  No pain.  He states that he has applied ice and symptoms have been improving.  He had redness of the palms of his hands bilaterally today after mowing.  He works as a Administrator.  He denies lip or tongue swelling, difficulty breathing or swallowing.  No lightheadedness or syncope.  No vomiting or diarrhea.  He has an allergy to Benadryl.       Home Medications Prior to Admission medications   Medication Sig Start Date End Date Taking? Authorizing Provider  omeprazole (PRILOSEC) 20 MG capsule Take 1 capsule (20 mg total) by mouth daily for 10 days. 10/11/21 10/21/21  Smoot, Shawn Route, PA-C      Allergies    Peach [prunus persica], Shrimp extract allergy skin test, and Benadryl [diphenhydramine]    Review of Systems   Review of Systems  Physical Exam Updated Vital Signs BP 128/79 (BP Location: Right Arm)   Pulse (!) 54   Temp 98.4 F (36.9 C) (Oral)   Resp 14   Wt 50.8 kg   SpO2 99%   BMI 17.54 kg/m  Physical Exam Vitals and nursing note reviewed.  Constitutional:      Appearance: He is well-developed.  HENT:     Head: Normocephalic and atraumatic.     Mouth/Throat:     Comments: No sign of angioedema Eyes:     Conjunctiva/sclera: Conjunctivae normal.  Cardiovascular:     Pulses: Normal pulses. No decreased pulses.  Musculoskeletal:        General: Tenderness present.     Cervical back: Normal range of motion and neck supple.     Right lower leg: No edema.     Left lower  leg: No edema.     Comments: Trace dorsal left hand swelling, about the base of the middle finger.  Skin:    General: Skin is warm and dry.     Comments: No hives or other rash  Neurological:     Mental Status: He is alert.     Sensory: No sensory deficit.     Comments: Motor, sensation, and vascular distal to the injury is fully intact.   Psychiatric:        Mood and Affect: Mood normal.     ED Results / Procedures / Treatments   Labs (all labs ordered are listed, but only abnormal results are displayed) Labs Reviewed - No data to display  EKG None  Radiology No results found.  Procedures Procedures    Medications Ordered in ED Medications - No data to display  ED Course/ Medical Decision Making/ A&P    Patient seen and examined. History obtained directly from patient.   Labs/EKG: None ordered.  Imaging: None ordered.  Medications/Fluids: None ordered.  Considered Benadryl, but patient is allergic.  Most recent vital signs reviewed and are as follows: BP 128/79 (BP Location: Right Arm)   Pulse (!) 54   Temp 98.4 F (36.9 C) (Oral)  Resp 14   Wt 50.8 kg   SpO2 99%   BMI 17.54 kg/m   Initial impression: Nonspecific hand swelling, improving.  Home treatment plan: Continue cool compresses, elevation.  Return instructions discussed with patient: Return with worsening pain or swelling, redness.  Follow-up instructions discussed with patient: Follow-up with PCP as needed                          Medical Decision Making  Patient with left hand swelling.  No history definitive for insect sting.  Regardless, reaction is minor.  No pain to suggest acute injury or fracture.  No cellulitis or abscess.  Upper extremity is neurovascularly intact.  No signs of anaphylaxis.        Final Clinical Impression(s) / ED Diagnoses Final diagnoses:  Swelling of left hand    Rx / DC Orders ED Discharge Orders     None         Renne Crigler,  PA-C 08/29/22 2223    Melene Plan, DO 08/30/22 0000

## 2022-08-29 NOTE — Discharge Instructions (Signed)
Your symptoms appear to be improving and you do not appear to be having a severe allergic reaction.  Please keep using cool compresses at home and elevate your hand.  Return with worsening swelling or if it spreads to other areas of the body.

## 2022-08-29 NOTE — ED Triage Notes (Signed)
Pt presents with left hand swelling x 1 hour. States he was mowing the grass, felt tinging in his left hand, and then noticed the swelling. Unsure if he was bit by something. States the swelling is improving. L radial pulse present, strong. Skin color WDL. Denies pain,  injury.

## 2022-09-17 IMAGING — CR DG TIBIA/FIBULA 2V*L*
4 series · 4 of 4 positions shown · non-contrast
Comparison: None Available.

CLINICAL DATA: Injury to left lower leg by mower with laceration to
anterior lower leg.

EXAM:
LEFT TIBIA AND FIBULA - 2 VIEW

[t tib/fib ap left (1 of 2)]
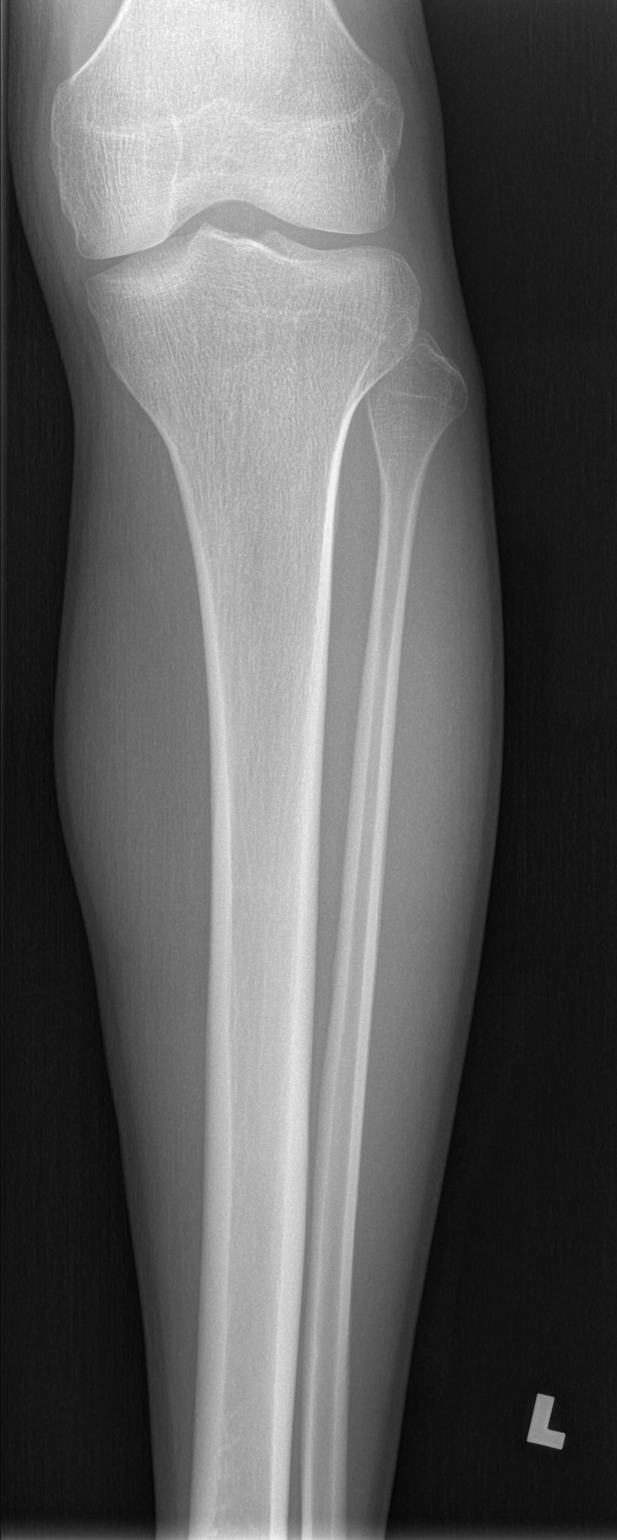

[t tib/fib ap left (2 of 2)]
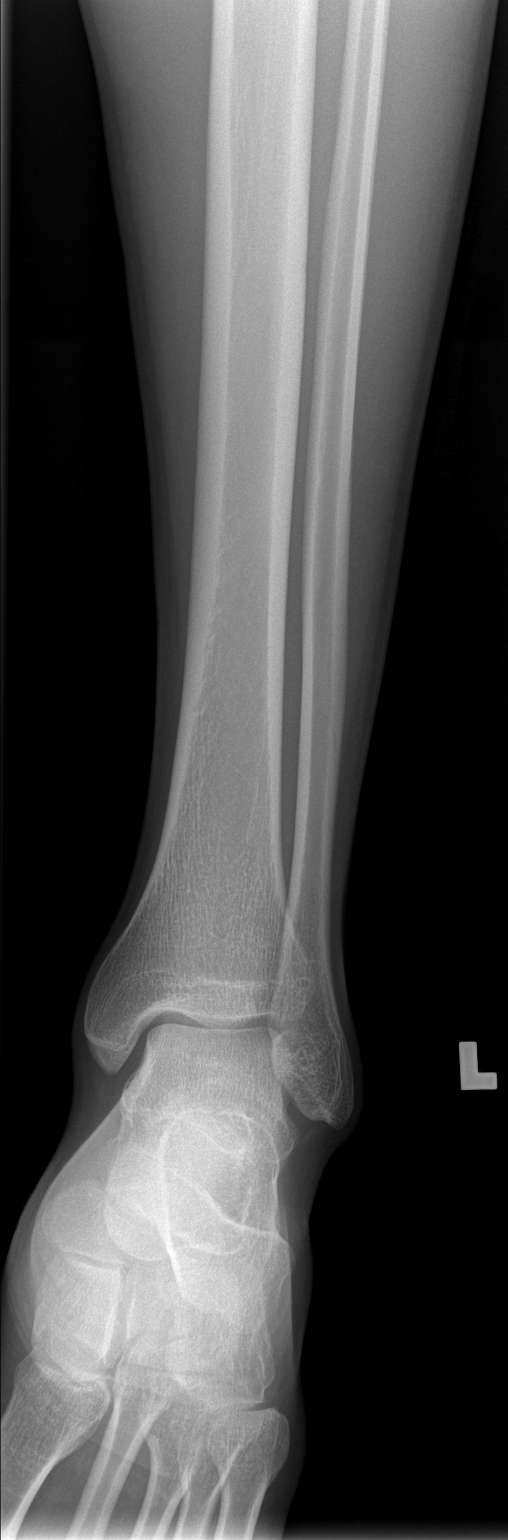

[t tib/fib lat left (1 of 2)]
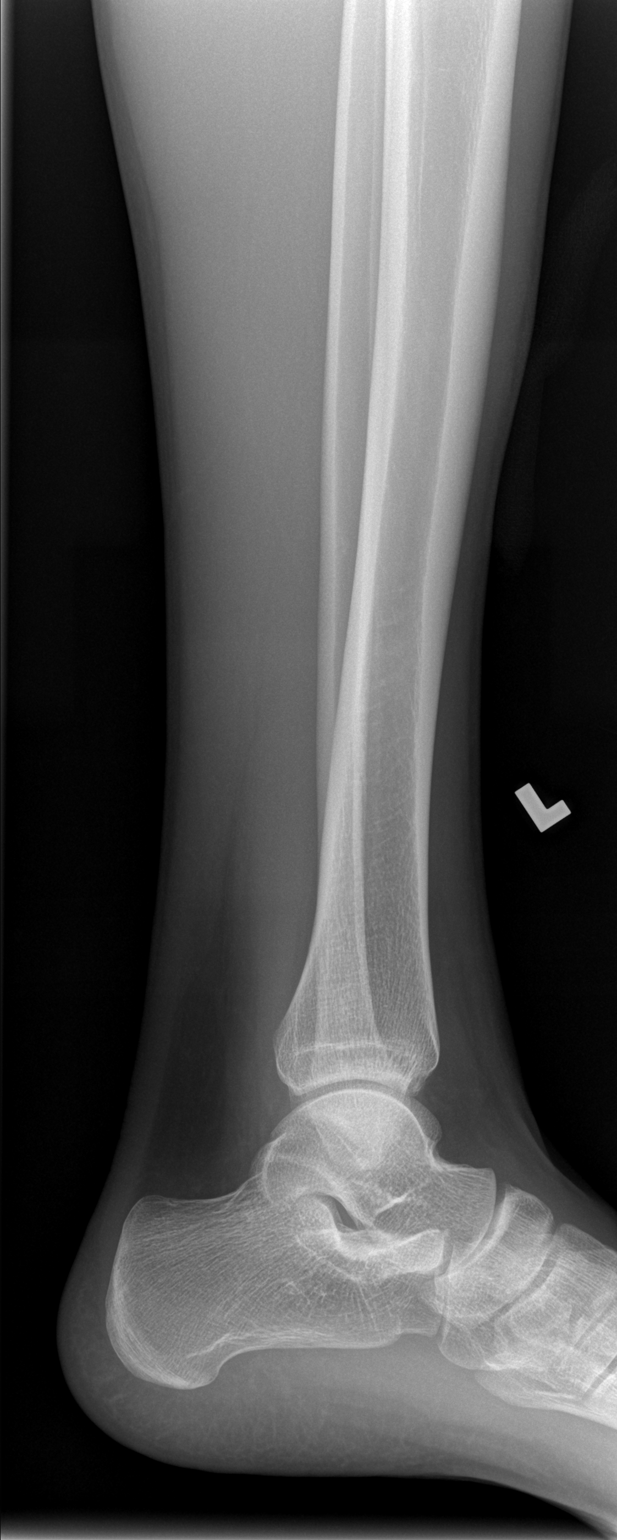

[t tib/fib lat left (2 of 2)]
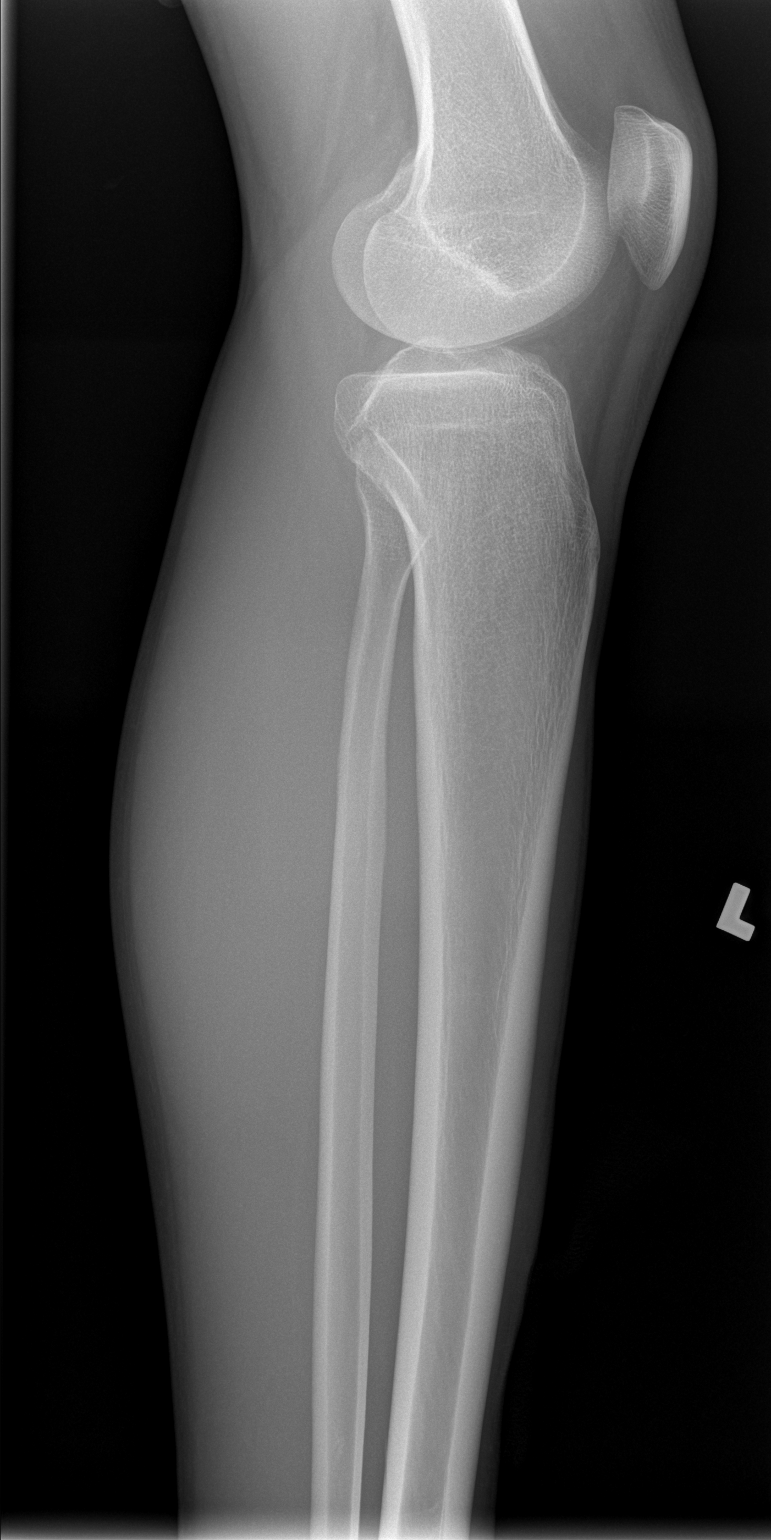

[4 of 4 positions shown; findings below may reference images not displayed]

FINDINGS: No fracture identified. No evidence of soft tissue foreign body. No
bony lesions or arthropathy.
IMPRESSION: Negative.

## 2022-10-27 ENCOUNTER — Other Ambulatory Visit: Payer: Self-pay

## 2022-10-27 ENCOUNTER — Encounter (HOSPITAL_BASED_OUTPATIENT_CLINIC_OR_DEPARTMENT_OTHER): Payer: Self-pay | Admitting: Emergency Medicine

## 2022-10-27 ENCOUNTER — Emergency Department (HOSPITAL_BASED_OUTPATIENT_CLINIC_OR_DEPARTMENT_OTHER)
Admission: EM | Admit: 2022-10-27 | Discharge: 2022-10-27 | Disposition: A | Discharge status: Home / Self Care | Payer: Medicaid Other | Attending: Emergency Medicine | Admitting: Emergency Medicine

## 2022-10-27 ENCOUNTER — Telehealth (HOSPITAL_BASED_OUTPATIENT_CLINIC_OR_DEPARTMENT_OTHER): Payer: Self-pay | Admitting: Emergency Medicine

## 2022-10-27 DIAGNOSIS — A64 Unspecified sexually transmitted disease: Secondary | ICD-10-CM | POA: Insufficient documentation

## 2022-10-27 DIAGNOSIS — N3000 Acute cystitis without hematuria: Secondary | ICD-10-CM | POA: Insufficient documentation

## 2022-10-27 DIAGNOSIS — Z711 Person with feared health complaint in whom no diagnosis is made: Secondary | ICD-10-CM

## 2022-10-27 LAB — URINALYSIS, MICROSCOPIC (REFLEX): RBC / HPF: NONE SEEN RBC/hpf (ref 0–5)

## 2022-10-27 LAB — URINALYSIS, ROUTINE W REFLEX MICROSCOPIC
Bilirubin Urine: NEGATIVE
Glucose, UA: NEGATIVE mg/dL
Hgb urine dipstick: NEGATIVE
Ketones, ur: NEGATIVE mg/dL
Nitrite: NEGATIVE
Protein, ur: NEGATIVE mg/dL
Specific Gravity, Urine: 1.02 (ref 1.005–1.030)
pH: 8 (ref 5.0–8.0)

## 2022-10-27 MED ORDER — CEPHALEXIN 500 MG PO CAPS: 500.0000 mg | ORAL_CAPSULE | Freq: Four times a day (QID) | ORAL | 0 refills | Status: AC

## 2022-10-27 MED ORDER — CEPHALEXIN 500 MG PO CAPS: 500.0000 mg | ORAL_CAPSULE | Freq: Four times a day (QID) | ORAL | 0 refills | Status: DC

## 2022-10-27 NOTE — Discharge Instructions (Addendum)
It was a pleasure taking care of you today!   Your urine showed concerns for an UTI today.  Your urine will be sent off for culture, you will be called if there is a need to change your antibiotic treatment based off the culture results.  You have pending results for gonorrhea and chlamydia, you will be called and notified if there are any concerning findings. You may follow up if you are experiencing increasing/worsening symptoms.

## 2022-10-27 NOTE — ED Provider Notes (Signed)
[facility] EMERGENCY DEPARTMENT Provider Note   CSN: [id number] Arrival date & time: 10/27/22  8315     History  Chief Complaint  Patient presents with   SEXUALLY TRANSMITTED DISEASE     Caleb Glover is a 21 y.o. male  who presents to the Emergency Department complaining of concerns for STD onset prior to arrival.  Patient notes he is sexually active with 1 male partner with unprotected intercourse.  Notes that she informed him she tested positive for BV and yeast however she declines testing positive for gonorrhea or chlamydia at this time.  Patient has associated white penile discharge. Patient has associated dysuria, suprapubic abdominal pain, frequency, urgency.  No medications tried prior to arrival.  Denies fever, hematuria, penile pain/swelling, testicular pain/swelling, nausea, vomiting, diarrhea, constipation.    The history is provided by the patient. No language interpreter was used.       Home Medications Prior to Admission medications   Medication Sig Start Date End Date Taking? Authorizing Provider  cephALEXin (KEFLEX) 500 MG capsule Take 1 capsule (500 mg total) by mouth 4 (four) times daily for 7 days. 10/27/22 11/03/22 Yes Ermalinda Joubert A, PA-C  omeprazole (PRILOSEC) 20 MG capsule Take 1 capsule (20 mg total) by mouth daily for 10 days. 10/11/21 10/21/21  Smoot, Shawn Route, PA-C      Allergies    Peach [prunus persica], Shrimp extract allergy skin test, and Benadryl [diphenhydramine]    Review of Systems   Review of Systems  Constitutional:  Negative for fever.  Gastrointestinal:  Positive for abdominal pain (lower since yesterday). Negative for constipation, diarrhea, nausea and vomiting.  Genitourinary:  Positive for dysuria, frequency, penile discharge (White) and urgency. Negative for hematuria, penile pain, penile swelling, scrotal swelling and testicular pain.  All other systems reviewed and are negative.   Physical Exam Updated  Vital Signs BP 116/71   Pulse 77   Temp 98.1 F (36.7 C) (Oral)   Resp 16   Ht 5\' 6"  (1.676 m)   Wt 49.9 kg   SpO2 100%   BMI 17.75 kg/m  Physical Exam Vitals and nursing note reviewed. Exam conducted with a chaperone present.  Constitutional:      General: He is not in acute distress.    Appearance: Normal appearance. He is not ill-appearing.  HENT:     Head: Normocephalic and atraumatic.     Right Ear: External ear normal.     Left Ear: External ear normal.  Eyes:     General: No scleral icterus. Cardiovascular:     Rate and Rhythm: Normal rate and regular rhythm.     Pulses: Normal pulses.     Heart sounds: Normal heart sounds.  Pulmonary:     Effort: Pulmonary effort is normal.     Breath sounds: Normal breath sounds.  Abdominal:     General: Abdomen is flat. Bowel sounds are normal. There is no distension.     Palpations: Abdomen is soft. There is no mass.     Tenderness: There is no abdominal tenderness.     Hernia: There is no hernia in the left inguinal area or right inguinal area.  Genitourinary:    Penis: Normal and circumcised. No erythema, tenderness, discharge, swelling or lesions.      Testes: Normal. Cremasteric reflex is present.        Right: Mass, tenderness or swelling not present. Right testis is descended.        Left: Mass, tenderness or swelling  not present. Left testis is descended.     Epididymis:     Right: Normal.     Left: Normal.     Comments: NT chaperone present for exam. bilateral testes normal, penis circumcised and normal without tenderness, discharge, lesions noted.  No tenderness to palpation noted to bilateral epididymis.  No concerns for inguinal hernias noted bilaterally. Musculoskeletal:        General: Normal range of motion.     Cervical back: Normal range of motion and neck supple.  Lymphadenopathy:     Lower Body: No right inguinal adenopathy. No left inguinal adenopathy.  Skin:    General: Skin is warm and dry.      Findings: No rash.  Neurological:     Mental Status: He is alert.     ED Results / Procedures / Treatments   Labs (all labs ordered are listed, but only abnormal results are displayed) Labs Reviewed  URINALYSIS, ROUTINE W REFLEX MICROSCOPIC - Abnormal; Notable for the following components:      Result Value   APPearance HAZY (*)    Leukocytes,Ua TRACE (*)    All other components within normal limits  URINALYSIS, MICROSCOPIC (REFLEX) - Abnormal; Notable for the following components:   Bacteria, UA MANY (*)    All other components within normal limits  URINE CULTURE  GC/CHLAMYDIA PROBE AMP ([place]) NOT AT [facility]    EKG None  Radiology No results found.  Procedures Procedures    Medications Ordered in ED Medications - No data to display  ED Course/ Medical Decision Making/ A&P                            Medical Decision Making Amount and/or Complexity of Data Reviewed Labs: ordered.  Risk Prescription drug management.    Pt presents with concerns for dysuria onset 6 days.  Patient also with white penile discharge x2 days.  Sexually active with 1 male partner with unprotected intercourse.  His male partner did not note testing positive for gonorrhea chlamydia however noted that she tested positive for yeast and bacterial vaginosis.  Patient afebrile.  On exam patient with mild suprapubic tenderness to palpation.  Nurse tech present for GU exam notable for, bilateral testes normal, penis circumcised and normal without tenderness, discharge, lesions noted.  No tenderness to palpation noted to bilateral epididymis.  No concerns for inguinal hernias noted bilaterally.  No acute cardiovascular, respiratory exam findings. Differential diagnosis includes acute cystitis, pyelonephritis, nephrolithiasis.    Labs:  I ordered, and personally interpreted labs.  The pertinent results include:   Urinalysis with trace of leukocytes and many bacteria noted on reflex  microscopic   Disposition: Patient presentation suspicious for acute cystitis without hematuria.  Doubt pyelonephritis or nephrolithiasis at this time. In-depth conversation held with patient regarding treatment for gonorrhea chlamydia today, however, patient declines that sexual partner testing positive for gonorrhea or chlamydia.  Patient notes that he would like to wait for his results prior to treatment.  Instructed patient that he will be able to see his results on MyChart as well as he will be called if there are any positive findings for treatment.  Patient declines HIV and syphilis testing today. After consideration of the diagnostic results and the patients response to treatment, I feel that the patient would benefit from Discharge home.  Patient sent with a prescription for Keflex to treat acute cystitis.  Urine culture sent.  GC chlamydia sent.  Supportive care measures and strict return precautions discussed with patient at bedside. Pt acknowledges and verbalizes understanding. Pt appears safe for discharge. Follow up as indicated in discharge paperwork.    This chart was dictated using voice recognition software, Dragon. Despite the best efforts of this provider to proofread and correct errors, errors may still occur which can change documentation meaning.   Final Clinical Impression(s) / ED Diagnoses Final diagnoses:  Concern about STD in male without diagnosis  Acute cystitis without hematuria    Rx / DC Orders ED Discharge Orders          Ordered    cephALEXin (KEFLEX) 500 MG capsule  4 times daily        10/27/22 1022              Zaniya Mcaulay A, PA-C 10/27/22 1042    Alvira Monday, MD 10/29/22 1028

## 2022-10-27 NOTE — ED Triage Notes (Signed)
Pt arrives pov, steady gait, requests STI testing, endorses lower abdominal pain with dysuria x 6 days, penile d/c x 2 days. Denies fever

## 2022-10-28 LAB — GC/CHLAMYDIA PROBE AMP (~~LOC~~) NOT AT ARMC
Chlamydia: POSITIVE — AB
Comment: NEGATIVE
Comment: NORMAL
Neisseria Gonorrhea: NEGATIVE

## 2022-10-28 LAB — URINE CULTURE: Culture: NO GROWTH

## 2022-10-29 ENCOUNTER — Telehealth (HOSPITAL_BASED_OUTPATIENT_CLINIC_OR_DEPARTMENT_OTHER): Payer: Self-pay

## 2022-10-29 NOTE — Telephone Encounter (Signed)
Spoke with patient regarding positive Chlamydia result. Informed to stop taking Keflex and to start new prescription of doxycycline BID for 7 days. Sent prescription to [facility] on [address].

## 2022-11-23 ENCOUNTER — Other Ambulatory Visit: Payer: Self-pay

## 2022-11-23 ENCOUNTER — Emergency Department (HOSPITAL_BASED_OUTPATIENT_CLINIC_OR_DEPARTMENT_OTHER)
Admission: EM | Admit: 2022-11-23 | Discharge: 2022-11-23 | Discharge status: Against Medical Advice | Payer: Medicaid Other | Attending: Student | Admitting: Student

## 2022-11-23 ENCOUNTER — Encounter (HOSPITAL_BASED_OUTPATIENT_CLINIC_OR_DEPARTMENT_OTHER): Payer: Self-pay | Admitting: Emergency Medicine

## 2022-11-23 DIAGNOSIS — Z0000 Encounter for general adult medical examination without abnormal findings: Secondary | ICD-10-CM | POA: Diagnosis present

## 2022-11-23 DIAGNOSIS — Z5321 Procedure and treatment not carried out due to patient leaving prior to being seen by health care provider: Secondary | ICD-10-CM | POA: Diagnosis not present

## 2022-11-23 LAB — URINALYSIS, ROUTINE W REFLEX MICROSCOPIC
Bilirubin Urine: NEGATIVE
Glucose, UA: NEGATIVE mg/dL
Hgb urine dipstick: NEGATIVE
Ketones, ur: NEGATIVE mg/dL
Leukocytes,Ua: NEGATIVE
Nitrite: NEGATIVE
Protein, ur: NEGATIVE mg/dL
Specific Gravity, Urine: >=1.03 (ref 1.005–1.030)
pH: 5.5 (ref 5.0–8.0)

## 2022-11-23 NOTE — ED Notes (Signed)
Called in [facility], no answer

## 2022-11-23 NOTE — ED Triage Notes (Signed)
Patient is here for retest of STD after treatment.

## 2022-11-25 LAB — GC/CHLAMYDIA PROBE AMP (~~LOC~~) NOT AT ARMC
Chlamydia: NEGATIVE
Comment: NEGATIVE
Comment: NORMAL
Neisseria Gonorrhea: NEGATIVE

## 2023-02-26 ENCOUNTER — Other Ambulatory Visit: Payer: Self-pay

## 2023-02-26 ENCOUNTER — Encounter (HOSPITAL_BASED_OUTPATIENT_CLINIC_OR_DEPARTMENT_OTHER): Payer: Self-pay

## 2023-02-26 ENCOUNTER — Emergency Department (HOSPITAL_BASED_OUTPATIENT_CLINIC_OR_DEPARTMENT_OTHER)
Admission: EM | Admit: 2023-02-26 | Discharge: 2023-02-26 | Disposition: A | Discharge status: Home / Self Care | Payer: Medicaid Other | Attending: Emergency Medicine | Admitting: Emergency Medicine

## 2023-02-26 ENCOUNTER — Emergency Department (HOSPITAL_BASED_OUTPATIENT_CLINIC_OR_DEPARTMENT_OTHER): Payer: Medicaid Other

## 2023-02-26 DIAGNOSIS — R1032 Left lower quadrant pain: Secondary | ICD-10-CM | POA: Insufficient documentation

## 2023-02-26 DIAGNOSIS — K920 Hematemesis: Secondary | ICD-10-CM | POA: Diagnosis not present

## 2023-02-26 DIAGNOSIS — R109 Unspecified abdominal pain: Secondary | ICD-10-CM

## 2023-02-26 DIAGNOSIS — K921 Melena: Secondary | ICD-10-CM

## 2023-02-26 HISTORY — DX: Panic disorder (episodic paroxysmal anxiety): F41.0

## 2023-02-26 LAB — COMPREHENSIVE METABOLIC PANEL
ALT: 24 U/L (ref 0–44)
AST: 25 U/L (ref 15–41)
Albumin: 4.6 g/dL (ref 3.5–5.0)
Alkaline Phosphatase: 87 U/L (ref 38–126)
Anion gap: 7 (ref 5–15)
BUN: 15 mg/dL (ref 6–20)
CO2: 26 mmol/L (ref 22–32)
Calcium: 9.2 mg/dL (ref 8.9–10.3)
Chloride: 104 mmol/L (ref 98–111)
Creatinine, Ser: 0.93 mg/dL (ref 0.61–1.24)
GFR, Estimated: >60 mL/min (ref >60)
Glucose, Bld: 95 mg/dL (ref 70–99)
Potassium: 3.9 mmol/L (ref 3.5–5.1)
Sodium: 137 mmol/L (ref 135–145)
Total Bilirubin: 0.8 mg/dL (ref 0.3–1.2)
Total Protein: 8 g/dL (ref 6.5–8.1)

## 2023-02-26 LAB — CBC
HCT: 44 % (ref 39.0–52.0)
Hemoglobin: 15.9 g/dL (ref 13.0–17.0)
MCH: 32.5 pg (ref 26.0–34.0)
MCHC: 36.1 g/dL — ABNORMAL HIGH (ref 30.0–36.0)
MCV: 90 fL (ref 80.0–100.0)
Platelets: 218 10*3/uL (ref 150–400)
RBC: 4.89 MIL/uL (ref 4.22–5.81)
RDW: 12.2 % (ref 11.5–15.5)
WBC: 7.1 10*3/uL (ref 4.0–10.5)
nRBC: 0 % (ref 0.0–0.2)

## 2023-02-26 LAB — URINALYSIS, ROUTINE W REFLEX MICROSCOPIC
Bilirubin Urine: NEGATIVE
Glucose, UA: NEGATIVE mg/dL
Hgb urine dipstick: NEGATIVE
Ketones, ur: NEGATIVE mg/dL
Leukocytes,Ua: NEGATIVE
Nitrite: NEGATIVE
Protein, ur: NEGATIVE mg/dL
Specific Gravity, Urine: <=1.005 (ref 1.005–1.030)
pH: 6 (ref 5.0–8.0)

## 2023-02-26 LAB — LIPASE, BLOOD: Lipase: 28 U/L (ref 11–51)

## 2023-02-26 LAB — POC OCCULT BLOOD, ED

## 2023-02-26 MED ORDER — MORPHINE SULFATE (PF) 4 MG/ML IV SOLN
4.0000 mg | Freq: Once | INTRAVENOUS | Status: DC
  Filled 2023-02-26: qty 1

## 2023-02-26 MED ORDER — ONDANSETRON HCL 4 MG/2ML IJ SOLN
4.0000 mg | Freq: Once | INTRAMUSCULAR | Status: DC
  Filled 2023-02-26: qty 2

## 2023-02-26 MED ORDER — IOHEXOL 300 MG/ML  SOLN
100.0000 mL | Freq: Once | INTRAMUSCULAR | Status: AC | PRN
  Administered 2023-02-26: 100 mL via INTRAVENOUS

## 2023-02-26 NOTE — ED Triage Notes (Signed)
Pt reports light blood on toilet paper when he wiped. Later during the day the pt reports he began to have LT sided abd pain. Got home around 5pm and wiped and noticed more bright red blood. Reports some dizziness and unsure if it is r/t vitamin D defficiency for which he takes 1000 units per day for it. Reports lots of burping; reports N/V/D.

## 2023-02-26 NOTE — ED Notes (Signed)
Pt returned from CT °

## 2023-02-26 NOTE — Discharge Instructions (Addendum)
Evaluation for your symptoms overall reassuring.  CT scan was negative for any infection or inflammation in your abdomen and pelvis.  Your labs are also reassuring.  Recommend you follow-up with your PCP in the next 3 to 4 days to reassess your symptoms.  If you have worsening abdominal pain, bloody vomit, worsening bloody stools, fever, blood in your urine or any other concerning symptom please return emergency department further evaluation.

## 2023-02-26 NOTE — ED Provider Notes (Signed)
[place] [facility] Provider Note   CSN: [id number] Arrival date & time: 02/26/23  1847     History  Chief Complaint  Patient presents with   Abdominal Pain   HPI Caleb Glover is a 22 y.o. male with a vitamin D deficiency presenting for abdominal pain and bleeding per rectum.  Symptoms started today.  Abdominal pain located in the left lower quadrant.  Feels sharp and nonradiating.  Also feels "hard and hurts to touch.  The pain is constant.  States that after a normal bowel movement this afternoon he noticed dark red blood with wiping.  No pain with defecation.  States the blood was only a trace amount on the tissue paper.  Denies urinary changes.  Denies vomiting diarrhea but endorses some nausea.  Last bowel movement was earlier this afternoon.  Denies fever and chills.  Denies shortness of breath, dizziness, lightheadedness, fatigue.   Abdominal Pain      Home Medications Prior to Admission medications   Medication Sig Start Date End Date Taking? Authorizing Provider  omeprazole (PRILOSEC) 20 MG capsule Take 1 capsule (20 mg total) by mouth daily for 10 days. 10/11/21 10/21/21  Smoot, Leary Roca, PA-C      Allergies    Peach [prunus persica], Shrimp extract allergy skin test, and Benadryl [diphenhydramine]    Review of Systems   Review of Systems  Gastrointestinal:  Positive for abdominal pain and blood in stool.    Physical Exam   Vitals:   02/26/23 1916 02/26/23 2242  BP: 115/83 104/62  Pulse: 82 82  Resp: 18 20  Temp: 97.7 F (36.5 C) 98.1 F (36.7 C)  SpO2: 100% 98%    CONSTITUTIONAL:  well-appearing, NAD NEURO:  Alert and oriented x 3, CN 3-12 grossly intact EYES:  eyes equal and reactive ENT/NECK:  Supple, no stridor  CARDIO: regular rate and rhythm, appears well-perfused  PULM:  No respiratory distress, CTAB GI/GU:  non-distended, soft, LLQ tenderness with guarding MSK/SPINE:  No gross deformities, no  edema, moves all extremities  SKIN:  no rash, atraumatic   *Additional and/or pertinent findings included in MDM below    ED Results / Procedures / Treatments   Labs (all labs ordered are listed, but only abnormal results are displayed) Labs Reviewed  CBC - Abnormal; Notable for the following components:      Result Value   MCHC 36.1 (*)    All other components within normal limits  POC OCCULT BLOOD, ED - Abnormal  LIPASE, BLOOD  COMPREHENSIVE METABOLIC PANEL  URINALYSIS, ROUTINE W REFLEX MICROSCOPIC    EKG None  Radiology CT Abdomen Pelvis W Contrast  Result Date: 02/26/2023 CLINICAL DATA:  Left-sided abdominal pain. EXAM: CT ABDOMEN AND PELVIS WITH CONTRAST TECHNIQUE: Multidetector CT imaging of the abdomen and pelvis was performed using the standard protocol following bolus administration of intravenous contrast. RADIATION DOSE REDUCTION: This exam was performed according to the departmental dose-optimization program which includes automated exposure control, adjustment of the mA and/or kV according to patient size and/or use of iterative reconstruction technique. CONTRAST:  141m OMNIPAQUE IOHEXOL 300 MG/ML  SOLN COMPARISON:  CT renal stone 08/18/2020 FINDINGS: Lower chest: No acute abnormality. Hepatobiliary: No focal liver abnormality is seen. No gallstones, gallbladder wall thickening, or biliary dilatation. Pancreas: Unremarkable. No pancreatic ductal dilatation or surrounding inflammatory changes. Spleen: Normal in size without focal abnormality. Adrenals/Urinary Tract: Adrenal glands are unremarkable. Kidneys are normal, without renal calculi, focal lesion, or hydronephrosis. Bladder  is unremarkable. Stomach/Bowel: Stomach is within normal limits. Appendix appears normal. No evidence of bowel wall thickening, distention, or inflammatory changes. Vascular/Lymphatic: No significant vascular findings are present. No enlarged abdominal or pelvic lymph nodes. Reproductive: Prostate  is unremarkable. Other: No abdominal wall hernia or abnormality. No abdominopelvic ascites. Musculoskeletal: No acute or significant osseous findings. IMPRESSION: No CT evidence of acute abdominal/pelvic process. Electronically Signed   By: Ronney Asters M.D.   On: 02/26/2023 22:45    Procedures Procedures    Medications Ordered in ED Medications  ondansetron (ZOFRAN) injection 4 mg (4 mg Intravenous Patient Refused/Not Given 02/26/23 2200)  morphine (PF) 4 MG/ML injection 4 mg (4 mg Intravenous Patient Refused/Not Given 02/26/23 2200)  iohexol (OMNIPAQUE) 300 MG/ML solution 100 mL (100 mLs Intravenous Contrast Given 02/26/23 2223)    ED Course/ Medical Decision Making/ A&P                             Medical Decision Making Amount and/or Complexity of Data Reviewed Labs: ordered. Radiology: ordered.  Risk Prescription drug management.   Initial Impression and Ddx 22 year old male who is well-appearing and hemodynamically stable presenting for abdominal pain and hematochezia.  Exam notable for left lower quadrant tenderness and Hemoccult positive.  DDx includes appendicitis, diverticulitis, fissure, hemorrhoid, upper GI bleed and symptomatic anemia. Patient PMH that increases complexity of ED encounter:  Vit D deficiency  Interpretation of Diagnostics I independent reviewed and interpreted the labs as followed: No acute derangement  - I independently visualized the following imaging with scope of interpretation limited to determining acute life threatening conditions related to emergency care: CT abdomen pelvis, which revealed no acute intra-abdominal pathology  Patient Reassessment and Ultimate Disposition/Management Treated pain with morphine and nausea with Zofran.  Patient was found to have left lower quadrant tenderness and was Hemoccult positive was initially concern for possible diverticular bleed or other lower GI bleed.  CT scan did not reveal any findings consistent with  this as etiology.  Considered symptomatic anemia but fortunately patient denies any associated symptoms and hemoglobin is stable at 15.9.  Upon reevaluation of his symptoms, patient stated he felt much better.  Patient overall is clinically well appearing with normal vitals.  Appropriate to discharge with follow-up with his PCP.  Discussed return precautions.  Patient management required discussion with the following services or consulting groups:  None  Complexity of Problems Addressed Acute complicated illness or Injury  Additional Data Reviewed and Analyzed Further history obtained from: None  Patient Encounter Risk Assessment Consideration of hospitalization         Final Clinical Impression(s) / ED Diagnoses Final diagnoses:  Abdominal pain, unspecified abdominal location  Blood in stool    Rx / DC Orders ED Discharge Orders     None         Harriet Pho, PA-C 02/26/23 2334    Drenda Freeze, MD 02/28/23 [phone]

## 2023-06-03 ENCOUNTER — Encounter (HOSPITAL_BASED_OUTPATIENT_CLINIC_OR_DEPARTMENT_OTHER): Payer: Self-pay | Admitting: Emergency Medicine

## 2023-06-03 ENCOUNTER — Other Ambulatory Visit: Payer: Self-pay

## 2023-06-03 ENCOUNTER — Emergency Department (HOSPITAL_BASED_OUTPATIENT_CLINIC_OR_DEPARTMENT_OTHER)
Admission: EM | Admit: 2023-06-03 | Discharge: 2023-06-03 | Disposition: A | Discharge status: Home / Self Care | Payer: Medicaid Other | Attending: Emergency Medicine | Admitting: Emergency Medicine

## 2023-06-03 DIAGNOSIS — R300 Dysuria: Secondary | ICD-10-CM

## 2023-06-03 DIAGNOSIS — Z113 Encounter for screening for infections with a predominantly sexual mode of transmission: Secondary | ICD-10-CM | POA: Diagnosis not present

## 2023-06-03 LAB — URINALYSIS, ROUTINE W REFLEX MICROSCOPIC
Bilirubin Urine: NEGATIVE
Glucose, UA: NEGATIVE mg/dL
Hgb urine dipstick: NEGATIVE
Ketones, ur: NEGATIVE mg/dL
Leukocytes,Ua: NEGATIVE
Nitrite: NEGATIVE
Protein, ur: NEGATIVE mg/dL
Specific Gravity, Urine: 1.025 (ref 1.005–1.030)
pH: 7 (ref 5.0–8.0)

## 2023-06-03 MED ORDER — CEFTRIAXONE SODIUM 500 MG IJ SOLR
500.0000 mg | INTRAMUSCULAR | Status: AC
  Administered 2023-06-03: 500 mg via INTRAMUSCULAR
  Filled 2023-06-03: qty 500

## 2023-06-03 MED ORDER — DOXYCYCLINE HYCLATE 100 MG PO CAPS: 100.0000 mg | ORAL_CAPSULE | Freq: Two times a day (BID) | ORAL | 0 refills | Status: DC

## 2023-06-03 NOTE — ED Provider Notes (Signed)
[place] [facility] Provider Note   CSN: [id number] Arrival date & time: 06/03/23  7829     History  Chief Complaint  Patient presents with   Abdominal Pain    Caleb Glover is a 22 y.o. male.  22 year old male with history of chlamydia who presents emergency department with dysuria.  Patient ports the past several days has been having dysuria.  No purulent discharge from his penis.  Is sexually active and is concerned about STIs.  Tested positive for chlamydia in the past but was adequately treated.  Denies any rashes, testicular pain, nausea or vomiting.  No diarrhea.  No fevers.  No flank pain.        Home Medications Prior to Admission medications   Medication Sig Start Date End Date Taking? Authorizing Provider  doxycycline (VIBRAMYCIN) 100 MG capsule Take 1 capsule (100 mg total) by mouth 2 (two) times daily. 06/03/23  Yes Rondel Baton, MD  omeprazole (PRILOSEC) 20 MG capsule Take 1 capsule (20 mg total) by mouth daily for 10 days. 10/11/21 10/21/21  Smoot, Shawn Route, PA-C      Allergies    Peach [prunus persica], Shrimp extract, and Benadryl [diphenhydramine]    Review of Systems   Review of Systems  Physical Exam Updated Vital Signs BP 120/73 (BP Location: Right Arm)   Pulse 93   Temp 98 F (36.7 C) (Oral)   Resp 18   Ht 5\' 6"  (1.676 m)   Wt 56.7 kg   SpO2 99%   BMI 20.18 kg/m  Physical Exam Constitutional:      Appearance: Normal appearance.  HENT:     Head: Normocephalic.  Abdominal:     General: There is no distension.     Palpations: There is no mass.     Tenderness: There is no abdominal tenderness. There is no guarding.  Genitourinary:    Penis: Normal.   Neurological:     Mental Status: He is alert.     ED Results / Procedures / Treatments   Labs (all labs ordered are listed, but only abnormal results are displayed) Labs Reviewed  URINALYSIS, ROUTINE W REFLEX MICROSCOPIC - Abnormal;  Notable for the following components:      Result Value   APPearance CLOUDY (*)    All other components within normal limits  HIV ANTIBODY (ROUTINE TESTING W REFLEX)  RPR  GC/CHLAMYDIA PROBE AMP ([place]) NOT AT [facility]    EKG None  Radiology No results found.  Procedures Procedures    Medications Ordered in ED Medications  cefTRIAXone (ROCEPHIN) injection 500 mg (500 mg Intramuscular Given 06/03/23 0725)    ED Course/ Medical Decision Making/ A&P                             Medical Decision Making Amount and/or Complexity of Data Reviewed Labs: ordered.  Risk Prescription drug management.   Caleb Glover is a 22 y.o. male with comorbidities that complicate the patient evaluation including chlamydia diagnosis who presents emergency department dysuria  Initial Ddx:  STI, urinary tract infection, epididymitis, appendicitis, testicular torsion  MDM:  Concern the patient may have STI and we will go ahead and test him at this time.  Is requesting serum tests and empiric testing so we will go ahead and do that.  Could potentially have a urinary tract infection as well as his dysuria so we will send off urinalysis.  No  testicular pain be concerning for epididymitis.  Low concern for torsion or appendicitis given his lower abdominal pain.  Plan:  HIV/RPR UA GC chlamydia IM ceftriaxone and doxycycline  ED Summary/Re-evaluation:  Urinalysis unremarkable.  Patient given ceftriaxone IM and said that he had some arm tingling afterwards.  No signs of an allergic reaction.  Patient was discharged with doxycycline prescription and instructions to follow-up on his MyChart regarding the results.  Instructed to have his partners tested and treated if necessary prior to resuming sexual intercourse.  This patient presents to the ED for concern of complaints listed in HPI, this involves an extensive number of treatment options, and is a complaint that carries with it a high  risk of complications and morbidity. Disposition including potential need for admission considered.   Dispo: DC Home. Return precautions discussed including, but not limited to, those listed in the AVS. Allowed pt time to ask questions which were answered fully prior to dc.  Records reviewed ED Visit Notes The following labs were independently interpreted: Urinalysis and show no acute abnormality I have reviewed the patients home medications and made adjustments as needed        Final Clinical Impression(s) / ED Diagnoses Final diagnoses:  Dysuria  Screening examination for STI    Rx / DC Orders ED Discharge Orders          Ordered    doxycycline (VIBRAMYCIN) 100 MG capsule  2 times daily        06/03/23 0713              Rondel Baton, MD 06/03/23 [phone]

## 2023-06-03 NOTE — ED Notes (Addendum)
Pt also was feeling like he was going to pass out when this nurse was drawing blood. Crackers and a beverage was given to pt.

## 2023-06-03 NOTE — Discharge Instructions (Addendum)
You were seen for painful urination in the emergency department.   At home, please take the doxycycline we have prescribed you in case you have a sexually transmitted infection.  If your chlamydia test is negative you can stop taking this antibiotic.  You were treated for gonorrhea with ceftriaxone you received in the emergency department (this is a one-time dose).  Check your MyChart online for the results of any tests that had not resulted by the time you left the emergency department.   Follow-up with your primary doctor in 2-3 days regarding your visit.  If you do not have a primary care doctor you may follow-up with [facility] primary care which is listed in this packet.  Please make sure any partners are tested and treated prior to having sex again.  Return immediately to the emergency department if you experience any of the following: Fever, severe lower abdominal pain, or any other concerning symptoms.    Thank you for visiting our Emergency Department. It was a pleasure taking care of you today.

## 2023-06-03 NOTE — ED Notes (Signed)
Pt complained of a weird sensation in his arm after he received his Rocephin injection in the left arm. After a few moments the sensation left and pt can move his arm with out any restrictions. EDP made aware.

## 2023-06-03 NOTE — ED Triage Notes (Signed)
Pt with lower abd pain x3 days. Denies V/D but endorses mild nausea. Pt also reports burning with urination x3 days. Pt concerned for STD and would like to get checked.

## 2023-06-04 LAB — GC/CHLAMYDIA PROBE AMP (~~LOC~~) NOT AT ARMC
Chlamydia: NEGATIVE
Comment: NEGATIVE
Comment: NORMAL
Neisseria Gonorrhea: NEGATIVE

## 2023-06-04 LAB — HIV ANTIBODY (ROUTINE TESTING W REFLEX): HIV Screen 4th Generation wRfx: NONREACTIVE

## 2023-06-04 LAB — RPR: RPR Ser Ql: NONREACTIVE

## 2023-06-09 ENCOUNTER — Emergency Department (HOSPITAL_BASED_OUTPATIENT_CLINIC_OR_DEPARTMENT_OTHER)
Admission: EM | Admit: 2023-06-09 | Discharge: 2023-06-09 | Disposition: A | Discharge status: Home / Self Care | Payer: Medicaid Other | Attending: Emergency Medicine | Admitting: Emergency Medicine

## 2023-06-09 ENCOUNTER — Encounter (HOSPITAL_BASED_OUTPATIENT_CLINIC_OR_DEPARTMENT_OTHER): Payer: Self-pay

## 2023-06-09 ENCOUNTER — Other Ambulatory Visit: Payer: Self-pay

## 2023-06-09 DIAGNOSIS — R519 Headache, unspecified: Secondary | ICD-10-CM | POA: Insufficient documentation

## 2023-06-09 DIAGNOSIS — K219 Gastro-esophageal reflux disease without esophagitis: Secondary | ICD-10-CM | POA: Insufficient documentation

## 2023-06-09 DIAGNOSIS — R12 Heartburn: Secondary | ICD-10-CM | POA: Diagnosis present

## 2023-06-09 MED ORDER — SODIUM CHLORIDE 0.9 % IV BOLUS
1000.0000 mL | Freq: Once | INTRAVENOUS | Status: AC
  Administered 2023-06-09: 1000 mL via INTRAVENOUS

## 2023-06-09 MED ORDER — ALUM & MAG HYDROXIDE-SIMETH 200-200-20 MG/5ML PO SUSP
30.0000 mL | Freq: Once | ORAL | Status: AC
  Administered 2023-06-09: 30 mL via ORAL
  Filled 2023-06-09: qty 30

## 2023-06-09 MED ORDER — LIDOCAINE VISCOUS HCL 2 % MT SOLN
15.0000 mL | Freq: Once | OROMUCOSAL | Status: AC
  Administered 2023-06-09: 15 mL via ORAL
  Filled 2023-06-09: qty 15

## 2023-06-09 MED ORDER — NAPROXEN 250 MG PO TABS
500.0000 mg | ORAL_TABLET | Freq: Once | ORAL | Status: DC
  Filled 2023-06-09: qty 2

## 2023-06-09 NOTE — ED Provider Notes (Signed)
EM[facility] DEPARTMENT AT [facility] Provider Note   CSN: [id number] Arrival date & time: 06/09/23  2113     History  Chief Complaint  Patient presents with   Headache   Dizziness    Caleb Glover is a 22 y.o. male.  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  Has had 3 bottles of water and he reports his urine is light yellow in color.  States that he is having burning in his chest with belching after eating meatballs and spaghetti for dinner, has a history of peptic ulcer disease. Feels like he is having a panic attack (history of same).        Home Medications Prior to Admission medications   Medication Sig Start Date End Date Taking? Authorizing Provider  doxycycline (VIBRAMYCIN) 100 MG capsule Take 1 capsule (100 mg total) by mouth 2 (two) times daily. 06/03/23   Rondel Baton, MD  omeprazole (PRILOSEC) 20 MG capsule Take 1 capsule (20 mg total) by mouth daily for 10 days. 10/11/21 10/21/21  Smoot, Shawn Route, PA-C      Allergies    Peach [prunus persica], Shrimp extract, and Benadryl [diphenhydramine]    Review of Systems   Review of Systems Negative except as per HPI Physical Exam Updated Vital Signs BP 122/80   Pulse 80   Temp 98.8 F (37.1 C) (Oral)   Resp 12   Ht 5\' 6"  (1.676 m)   Wt 55.8 kg   SpO2 99%   BMI 19.85 kg/m  Physical Exam Vitals and nursing note reviewed.  Constitutional:      General: He is not in acute distress.    Appearance: He is well-developed. He is not diaphoretic.  HENT:     Head: Normocephalic and atraumatic.     Mouth/Throat:     Mouth: Mucous membranes are moist.     Pharynx: Oropharynx is clear.  Eyes:     Extraocular Movements: Extraocular movements intact.  Cardiovascular:     Heart sounds: Normal heart sounds.  Pulmonary:     Effort: Pulmonary effort is normal.     Breath sounds: Normal breath sounds.   Abdominal:     Palpations: Abdomen is soft.     Tenderness: There is no abdominal tenderness.  Musculoskeletal:     Cervical back: Neck supple.  Skin:    General: Skin is warm and dry.  Neurological:     Mental Status: He is alert and oriented to person, place, and time.     GCS: GCS eye subscore is 4. GCS verbal subscore is 5. GCS motor subscore is 6.     Cranial Nerves: No cranial nerve deficit or facial asymmetry.  Psychiatric:        Mood and Affect: Mood is anxious.        Behavior: Behavior normal.     ED Results / Procedures / Treatments   Labs (all labs ordered are listed, but only abnormal results are displayed) Labs Reviewed - No data to display  EKG EKG Interpretation  Date/Time:  Monday June 09 2023 21:19:53 EDT Ventricular Rate:  84 PR Interval:  155 QRS Duration: 91 QT Interval:  349 QTC Calculation: 413 R Axis:   74 Text Interpretation: Sinus rhythm Confirmed by Lockie Mola, Adam ([id number]) on 06/09/2023 9:27:59 PM  Radiology No results found.  Procedures Procedures    Medications Ordered in ED Medications  sodium chloride 0.9 %  bolus 1,000 mL (1,000 mLs Intravenous New Bag/Given 06/09/23 2156)  alum & mag hydroxide-simeth (MAALOX/MYLANTA) 200-200-20 MG/5ML suspension 30 mL (30 mLs Oral Given 06/09/23 2145)    And  lidocaine (XYLOCAINE) 2 % viscous mouth solution 15 mL (15 mLs Oral Given 06/09/23 2145)    ED Course/ Medical Decision Making/ A&P                             Medical Decision Making  This patient presents to the ED for concern of headache, burning sensation in chest, this involves an extensive number of treatment options, and is a complaint that carries with it a high risk of complications and morbidity.  The differential diagnosis includes but not limited to GERD, peptic ulcer disease, anxiety attack, dehydration   Co morbidities that complicate the patient evaluation  Anxiety, GERD,    Additional history obtained:  External records  from outside source obtained and reviewed including CT head from 2022 which is normal Labs reviewed from 06/03/2023.  Patient was provided with antibiotics for GC chlamydia which were negative, he took 1 dose and did not continue.   Problem List / ED Course / Critical interventions / Medication management  22 year old male with history of GERD and panic attack arrives with complaint of headache onset earlier today with photophobia, nausea, and feeling dizzy and panic attack.  Also reports a burning in his chest, belching on exam.  Patient is anxious appearing.  Exam is unremarkable otherwise.  He is provided with IV fluids and a GI cocktail.  His burning in the chest has resolved, his headache is resolved and he is feeling much better.  Patient is stable for discharge, advised to follow-up with PCP and provided with return to ER precautions. I ordered medication including GI cocktail for chest discomfort Reevaluation of the patient after these medicines showed that the patient improved I have reviewed the patients home medicines and have made adjustments as needed   Social Determinants of Health:  Lives with spouse   Test / Admission - Considered:  Consider labs however vitals stable, symptoms improved with IVF and GI cocktail          Final Clinical Impression(s) / ED Diagnoses Final diagnoses:  Acute nonintractable headache, unspecified headache type  Gastroesophageal reflux disease without esophagitis    Rx / DC Orders ED Discharge Orders     None         Jeannie Fend, PA-C 06/09/23 2245    Virgina Norfolk, DO 06/09/23 2252

## 2023-06-09 NOTE — ED Notes (Signed)
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  States he drinks plenty of water but has HA and nausea and reflux. Urine is clear.

## 2023-06-09 NOTE — ED Triage Notes (Signed)
Pt with HA 6/10 that began 4 hours ago. Was working outside and left work early. Pt has had 3 water bottles (urine is light and clear). Pt also took Tylenol for pain. Pt has photophobia, nausea, dizziness with standing or sitting. Not orthostatic.

## 2023-06-09 NOTE — Discharge Instructions (Signed)
Home to rest.  Recheck with your primary care provider.  Return to ER for worsening or concerning symptoms.

## 2023-06-09 NOTE — ED Notes (Signed)
Pt. Is alert and oriented with reports of feeling like he has muscle cramping due to working outside.  Pt. Is able to move all extremities.  Pt. Has no s/s of distress.

## 2024-02-28 ENCOUNTER — Ambulatory Visit
Admission: EM | Admit: 2024-02-28 | Discharge: 2024-02-28 | Disposition: A | Discharge status: Home / Self Care | Attending: Internal Medicine | Admitting: Internal Medicine

## 2024-02-28 ENCOUNTER — Encounter: Payer: Self-pay | Admitting: Emergency Medicine

## 2024-02-28 DIAGNOSIS — R350 Frequency of micturition: Secondary | ICD-10-CM | POA: Diagnosis not present

## 2024-02-28 DIAGNOSIS — Q386 Other congenital malformations of mouth: Secondary | ICD-10-CM

## 2024-02-28 DIAGNOSIS — N4889 Other specified disorders of penis: Secondary | ICD-10-CM | POA: Insufficient documentation

## 2024-02-28 NOTE — ED Provider Notes (Signed)
 [name]    CSN: [id number] Arrival date & time: 02/28/24  1039      History   Chief Complaint Chief Complaint  Patient presents with   SEXUALLY TRANSMITTED DISEASE    HPI Caleb Glover is a 23 y.o. male.   Patient presents to urgent care for evaluation of penile lesion that he first noticed yesterday to the tip of the penis.  Describes lesion as a red bump that does not hurt or itch. He is a monogamous relationship with his wife and has not had any recent new sexual partners or known exposures to STD.  Reports chronic urinary frequency that is being worked up by his primary care provider, otherwise denies dysuria, leg pain, nausea, vomiting, penile discharge, body aches, headache, fever, chills.     Past Medical History:  Diagnosis Date   Panic attacks    Vitamin D deficiency     There are no active problems to display for this patient.   Past Surgical History:  Procedure Laterality Date   WISDOM TOOTH EXTRACTION         Home Medications    Prior to Admission medications   Medication Sig Start Date End Date Taking? Authorizing Provider  Vitamin D, Ergocalciferol, (DRISDOL) 1.25 MG (50000 UNIT) CAPS capsule Take by mouth. 02/11/22  Yes [provider]  doxycycline (VIBRAMYCIN) 100 MG capsule Take 1 capsule (100 mg total) by mouth 2 (two) times daily. Patient not taking: Reported on 02/28/2024 06/03/23   Rondel Baton, MD  omeprazole (PRILOSEC) 20 MG capsule Take 1 capsule (20 mg total) by mouth daily for 10 days. Patient not taking: Reported on 02/28/2024 10/11/21 10/21/21  Smoot, Freda Jackson    Family History History reviewed. No pertinent family history.  Social History Social History   Tobacco Use   Smoking status: Never   Smokeless tobacco: Never  Vaping Use   Vaping status: Never Used  Substance Use Topics   Alcohol use: No   Drug use: No     Allergies   Peach [prunus persica], Shrimp extract, and Benadryl  [diphenhydramine]   Review of Systems Review of Systems Per HPI  Physical Exam Triage Vital Signs ED Triage Vitals  Encounter Vitals Group     BP 02/28/24 1059 123/78     Systolic BP Percentile --      Diastolic BP Percentile --      Pulse Rate 02/28/24 1059 77     Resp 02/28/24 1059 17     Temp 02/28/24 1059 98.2 F (36.8 C)     Temp Source 02/28/24 1059 Oral     SpO2 02/28/24 1059 98 %     Weight --      Height --      Head Circumference --      Peak Flow --      Pain Score 02/28/24 1105 0     Pain Loc --      Pain Education --      Exclude from Growth Chart --    No data found.  Updated Vital Signs BP 123/78 (BP Location: Right Arm)   Pulse 77   Temp 98.2 F (36.8 C) (Oral)   Resp 17   SpO2 98%   Visual Acuity Right Eye Distance:   Left Eye Distance:   Bilateral Distance:    Right Eye Near:   Left Eye Near:    Bilateral Near:     Physical Exam Vitals and nursing note reviewed. Exam  conducted with a chaperone present Dorathy Daft, RN present for exam).  Constitutional:      Appearance: He is not ill-appearing or toxic-appearing.  HENT:     Head: Normocephalic and atraumatic.     Right Ear: Hearing and external ear normal.     Left Ear: Hearing and external ear normal.     Nose: Nose normal.     Mouth/Throat:     Lips: Pink.  Eyes:     General: Lids are normal. Vision grossly intact. Gaze aligned appropriately.     Extraocular Movements: Extraocular movements intact.     Conjunctiva/sclera: Conjunctivae normal.  Pulmonary:     Effort: Pulmonary effort is normal.  Genitourinary:    Penis: Circumcised. Lesions (Fordyce lesions) present. No erythema, tenderness or discharge.      Comments: Skin colored papules to the glans penis consistent with Fordyce spots.  No ulcerative or crater appearing lesions.  No tenderness. Musculoskeletal:     Cervical back: Neck supple.  Skin:    General: Skin is warm and dry.     Capillary Refill: Capillary refill takes  less than 2 seconds.     Findings: No rash.  Neurological:     General: No focal deficit present.     Mental Status: He is alert and oriented to person, place, and time. Mental status is at baseline.     Cranial Nerves: No dysarthria or facial asymmetry.  Psychiatric:        Mood and Affect: Mood normal.        Speech: Speech normal.        Behavior: Behavior normal.        Thought Content: Thought content normal.        Judgment: Judgment normal.      UC Treatments / Results  Labs (all labs ordered are listed, but only abnormal results are displayed) Labs Reviewed  CYTOLOGY, (ORAL, ANAL, URETHRAL) ANCILLARY ONLY    EKG   Radiology No results found.  Procedures Procedures (including critical care time)  Medications Ordered in UC Medications - No data to display  Initial Impression / Assessment and Plan / UC Course  I have reviewed the triage vital signs and the nursing notes.  Pertinent labs & imaging results that were available during my care of the patient were reviewed by me and considered in my medical decision making (see chart for details).   1.  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 Low suspicion for balanitis, cellulitis of the penis, and STD.  No signs of syphilis or HSV. Cytology swab is pending for STD screening, discussed safe sex practices. Staff will call if positive for infections and treat accordingly.  Counseled patient on potential for adverse effects with medications prescribed/recommended today, strict ER and return-to-clinic precautions discussed, patient verbalized understanding.    Final Clinical Impressions(s) / UC Diagnoses   Final diagnoses:  Fordyce spots     Discharge Instructions      The lesions on your penis are normal and called Fordyce spots. This is not an STD. The STD swab in the next 2 to 3 days, we will call if any of your results are positive and treat accordingly.      ED Prescriptions    None    PDMP not reviewed this encounter.   Carlisle Beers, [place] 02/28/24 1209

## 2024-02-28 NOTE — Discharge Instructions (Signed)
 The lesions on your penis are normal and called Fordyce spots. This is not an STD. The STD swab in the next 2 to 3 days, we will call if any of your results are positive and treat accordingly.

## 2024-02-28 NOTE — ED Triage Notes (Addendum)
 Pt presents for STD testing due to lesion on tip of penis. He first noticed lesion yesterday.

## 2024-03-01 LAB — CYTOLOGY, (ORAL, ANAL, URETHRAL) ANCILLARY ONLY
Chlamydia: NEGATIVE
Comment: NEGATIVE
Comment: NEGATIVE
Comment: NORMAL
Neisseria Gonorrhea: NEGATIVE
Trichomonas: NEGATIVE

## 2024-07-12 ENCOUNTER — Observation Stay (HOSPITAL_BASED_OUTPATIENT_CLINIC_OR_DEPARTMENT_OTHER): Admitting: Anesthesiology

## 2024-07-12 ENCOUNTER — Other Ambulatory Visit: Payer: Self-pay

## 2024-07-12 ENCOUNTER — Inpatient Hospital Stay: Admit: 2024-07-12 | Admitting: General Surgery

## 2024-07-12 ENCOUNTER — Encounter (HOSPITAL_COMMUNITY)
Admission: EM | Disposition: A | Discharge status: Home / Self Care | Payer: Self-pay | Source: Home / Self Care | Attending: Emergency Medicine

## 2024-07-12 ENCOUNTER — Observation Stay (HOSPITAL_COMMUNITY): Admitting: Anesthesiology

## 2024-07-12 ENCOUNTER — Encounter (HOSPITAL_BASED_OUTPATIENT_CLINIC_OR_DEPARTMENT_OTHER): Payer: Self-pay | Admitting: Emergency Medicine

## 2024-07-12 ENCOUNTER — Emergency Department (HOSPITAL_BASED_OUTPATIENT_CLINIC_OR_DEPARTMENT_OTHER)

## 2024-07-12 ENCOUNTER — Observation Stay (HOSPITAL_BASED_OUTPATIENT_CLINIC_OR_DEPARTMENT_OTHER)
Admission: EM | Admit: 2024-07-12 | Discharge: 2024-07-13 | Disposition: A | Discharge status: Home / Self Care | Attending: Surgery | Admitting: Surgery

## 2024-07-12 DIAGNOSIS — R531 Weakness: Secondary | ICD-10-CM | POA: Insufficient documentation

## 2024-07-12 DIAGNOSIS — F5101 Primary insomnia: Secondary | ICD-10-CM | POA: Diagnosis not present

## 2024-07-12 DIAGNOSIS — K3580 Unspecified acute appendicitis: Secondary | ICD-10-CM

## 2024-07-12 HISTORY — DX: Other complications of anesthesia, initial encounter: T88.59XA

## 2024-07-12 HISTORY — DX: Family history of other specified conditions: Z84.89

## 2024-07-12 HISTORY — PX: LAPAROSCOPIC APPENDECTOMY: SHX408

## 2024-07-12 LAB — URINALYSIS, ROUTINE W REFLEX MICROSCOPIC
Bilirubin Urine: NEGATIVE
Glucose, UA: NEGATIVE mg/dL
Hgb urine dipstick: NEGATIVE
Ketones, ur: NEGATIVE mg/dL
Leukocytes,Ua: NEGATIVE
Nitrite: NEGATIVE
Protein, ur: NEGATIVE mg/dL
Specific Gravity, Urine: 1.01 (ref 1.005–1.030)
pH: 7 (ref 5.0–8.0)

## 2024-07-12 LAB — CBC
HCT: 43.6 % (ref 39.0–52.0)
Hemoglobin: 15.4 g/dL (ref 13.0–17.0)
MCH: 31.8 pg (ref 26.0–34.0)
MCHC: 35.3 g/dL (ref 30.0–36.0)
MCV: 90.1 fL (ref 80.0–100.0)
Platelets: 220 K/uL (ref 150–400)
RBC: 4.84 MIL/uL (ref 4.22–5.81)
RDW: 12.1 % (ref 11.5–15.5)
WBC: 12.5 K/uL — ABNORMAL HIGH (ref 4.0–10.5)
nRBC: 0 % (ref 0.0–0.2)

## 2024-07-12 LAB — COMPREHENSIVE METABOLIC PANEL WITH GFR
ALT: 19 U/L (ref 0–44)
AST: 22 U/L (ref 15–41)
Albumin: 4.9 g/dL (ref 3.5–5.0)
Alkaline Phosphatase: 87 U/L (ref 38–126)
Anion gap: 12 (ref 5–15)
BUN: 11 mg/dL (ref 6–20)
CO2: 25 mmol/L (ref 22–32)
Calcium: 9.5 mg/dL (ref 8.9–10.3)
Chloride: 103 mmol/L (ref 98–111)
Creatinine, Ser: 0.82 mg/dL (ref 0.61–1.24)
GFR, Estimated: >60 mL/min (ref >60)
Glucose, Bld: 93 mg/dL (ref 70–99)
Potassium: 3.6 mmol/L (ref 3.5–5.1)
Sodium: 140 mmol/L (ref 135–145)
Total Bilirubin: 0.6 mg/dL (ref 0.0–1.2)
Total Protein: 7.7 g/dL (ref 6.5–8.1)

## 2024-07-12 LAB — LIPASE, BLOOD: Lipase: 20 U/L (ref 11–51)

## 2024-07-12 LAB — HIV ANTIBODY (ROUTINE TESTING W REFLEX): HIV Screen 4th Generation wRfx: NONREACTIVE

## 2024-07-12 SURGERY — APPENDECTOMY, LAPAROSCOPIC
Anesthesia: General | Site: Abdomen

## 2024-07-12 MED ORDER — METRONIDAZOLE 500 MG/100ML IV SOLN
500.0000 mg | Freq: Two times a day (BID) | INTRAVENOUS | Status: DC
  Administered 2024-07-12 – 2024-07-13 (×2): 500 mg via INTRAVENOUS
  Filled 2024-07-12 (×3): qty 100

## 2024-07-12 MED ORDER — PROPOFOL 10 MG/ML IV BOLUS
INTRAVENOUS | Status: DC | PRN
  Administered 2024-07-12: 200 mg via INTRAVENOUS

## 2024-07-12 MED ORDER — MORPHINE SULFATE (PF) 2 MG/ML IV SOLN: 1.0000 mg | INTRAVENOUS | Status: DC | PRN

## 2024-07-12 MED ORDER — LACTATED RINGERS IR SOLN
Status: DC | PRN
  Administered 2024-07-12: 1000 mL

## 2024-07-12 MED ORDER — ONDANSETRON HCL 4 MG/2ML IJ SOLN
4.0000 mg | Freq: Once | INTRAMUSCULAR | Status: DC
  Filled 2024-07-12: qty 2

## 2024-07-12 MED ORDER — ACETAMINOPHEN 500 MG PO TABS
1000.0000 mg | ORAL_TABLET | Freq: Four times a day (QID) | ORAL | Status: DC
  Administered 2024-07-13 (×2): 1000 mg via ORAL
  Filled 2024-07-12 (×3): qty 2

## 2024-07-12 MED ORDER — ONDANSETRON HCL 4 MG/2ML IJ SOLN: 4.0000 mg | Freq: Four times a day (QID) | INTRAMUSCULAR | Status: DC | PRN

## 2024-07-12 MED ORDER — PHENYLEPHRINE 80 MCG/ML (10ML) SYRINGE FOR IV PUSH (FOR BLOOD PRESSURE SUPPORT)
PREFILLED_SYRINGE | INTRAVENOUS | Status: AC
  Filled 2024-07-12: qty 30

## 2024-07-12 MED ORDER — LIDOCAINE HCL 1 % IJ SOLN
INTRAMUSCULAR | Status: AC
  Filled 2024-07-12: qty 20

## 2024-07-12 MED ORDER — ROCURONIUM BROMIDE 10 MG/ML (PF) SYRINGE
PREFILLED_SYRINGE | INTRAVENOUS | Status: AC
  Filled 2024-07-12: qty 30

## 2024-07-12 MED ORDER — MIDAZOLAM HCL 2 MG/2ML IJ SOLN
INTRAMUSCULAR | Status: DC | PRN
Start: 2024-07-12 — End: 2024-07-12
  Administered 2024-07-12: 2 mg via INTRAVENOUS

## 2024-07-12 MED ORDER — ROCURONIUM BROMIDE 10 MG/ML (PF) SYRINGE
PREFILLED_SYRINGE | INTRAVENOUS | Status: DC | PRN
  Administered 2024-07-12: 50 mg via INTRAVENOUS

## 2024-07-12 MED ORDER — DEXAMETHASONE SODIUM PHOSPHATE 10 MG/ML IJ SOLN
INTRAMUSCULAR | Status: DC | PRN
  Administered 2024-07-12: 10 mg via INTRAVENOUS

## 2024-07-12 MED ORDER — ONDANSETRON HCL 4 MG/2ML IJ SOLN
INTRAMUSCULAR | Status: AC
  Filled 2024-07-12: qty 10

## 2024-07-12 MED ORDER — FENTANYL CITRATE (PF) 100 MCG/2ML IJ SOLN
INTRAMUSCULAR | Status: AC
  Filled 2024-07-12: qty 2

## 2024-07-12 MED ORDER — CHLORHEXIDINE GLUCONATE 0.12 % MT SOLN
15.0000 mL | Freq: Once | OROMUCOSAL | Status: AC
  Administered 2024-07-12: 15 mL via OROMUCOSAL

## 2024-07-12 MED ORDER — CLONAZEPAM 0.5 MG PO TBDP: 0.5000 mg | ORAL_TABLET | Freq: Every day | ORAL | Status: DC

## 2024-07-12 MED ORDER — OXYCODONE HCL 5 MG PO TABS
5.0000 mg | ORAL_TABLET | ORAL | Status: DC | PRN
  Administered 2024-07-13: 5 mg via ORAL
  Administered 2024-07-13 (×2): 10 mg via ORAL
  Filled 2024-07-12: qty 2
  Filled 2024-07-12: qty 1
  Filled 2024-07-12: qty 2

## 2024-07-12 MED ORDER — PROPOFOL 500 MG/50ML IV EMUL
INTRAVENOUS | Status: DC | PRN
  Administered 2024-07-12: 180 ug/kg/min via INTRAVENOUS

## 2024-07-12 MED ORDER — IOHEXOL 300 MG/ML  SOLN
100.0000 mL | Freq: Once | INTRAMUSCULAR | Status: AC | PRN
  Administered 2024-07-12: 100 mL via INTRAVENOUS

## 2024-07-12 MED ORDER — LIDOCAINE HCL (PF) 2 % IJ SOLN
INTRAMUSCULAR | Status: DC | PRN
  Administered 2024-07-12: 60 mg via INTRADERMAL

## 2024-07-12 MED ORDER — ENOXAPARIN SODIUM 40 MG/0.4ML IJ SOSY: 40.0000 mg | PREFILLED_SYRINGE | INTRAMUSCULAR | Status: DC

## 2024-07-12 MED ORDER — ACETAMINOPHEN 10 MG/ML IV SOLN
INTRAVENOUS | Status: AC
  Filled 2024-07-12: qty 100

## 2024-07-12 MED ORDER — SODIUM CHLORIDE 0.9 % IV SOLN
2.0000 g | INTRAVENOUS | Status: DC
  Administered 2024-07-12: 2 g via INTRAVENOUS
  Filled 2024-07-12: qty 20

## 2024-07-12 MED ORDER — FENTANYL CITRATE (PF) 250 MCG/5ML IJ SOLN
INTRAMUSCULAR | Status: DC | PRN
  Administered 2024-07-12 (×2): 50 ug via INTRAVENOUS
  Administered 2024-07-12 (×2): 25 ug via INTRAVENOUS

## 2024-07-12 MED ORDER — LIDOCAINE HCL (PF) 1 % IJ SOLN
INTRAMUSCULAR | Status: DC | PRN
  Administered 2024-07-12: 3.5 mL

## 2024-07-12 MED ORDER — HYDROMORPHONE HCL 1 MG/ML IJ SOLN
0.5000 mg | Freq: Once | INTRAMUSCULAR | Status: AC
  Administered 2024-07-12: 0.5 mg via INTRAVENOUS
  Filled 2024-07-12: qty 1

## 2024-07-12 MED ORDER — SUGAMMADEX SODIUM 200 MG/2ML IV SOLN
INTRAVENOUS | Status: DC | PRN
  Administered 2024-07-12: 200 mg via INTRAVENOUS

## 2024-07-12 MED ORDER — OXYCODONE HCL 5 MG PO TABS: 5.0000 mg | ORAL_TABLET | Freq: Once | ORAL | Status: DC | PRN

## 2024-07-12 MED ORDER — MELATONIN 3 MG PO TABS: 3.0000 mg | ORAL_TABLET | Freq: Every evening | ORAL | Status: DC | PRN

## 2024-07-12 MED ORDER — MIDAZOLAM HCL 2 MG/2ML IJ SOLN
INTRAMUSCULAR | Status: AC
  Filled 2024-07-12: qty 2

## 2024-07-12 MED ORDER — LACTATED RINGERS IV SOLN: INTRAVENOUS | Status: DC

## 2024-07-12 MED ORDER — BUPIVACAINE-EPINEPHRINE (PF) 0.25% -1:200000 IJ SOLN
INTRAMUSCULAR | Status: AC
  Filled 2024-07-12: qty 30

## 2024-07-12 MED ORDER — SUCCINYLCHOLINE CHLORIDE 200 MG/10ML IV SOSY
PREFILLED_SYRINGE | INTRAVENOUS | Status: AC
  Filled 2024-07-12: qty 10

## 2024-07-12 MED ORDER — PROPOFOL 10 MG/ML IV BOLUS
INTRAVENOUS | Status: AC
  Filled 2024-07-12: qty 20

## 2024-07-12 MED ORDER — DEXMEDETOMIDINE HCL IN NACL 80 MCG/20ML IV SOLN
INTRAVENOUS | Status: DC | PRN
  Administered 2024-07-12 (×2): 8 ug via INTRAVENOUS

## 2024-07-12 MED ORDER — HYDROMORPHONE HCL 1 MG/ML IJ SOLN
INTRAMUSCULAR | Status: AC
Start: 2024-07-12 — End: 2024-07-12
  Filled 2024-07-12: qty 1

## 2024-07-12 MED ORDER — KCL IN DEXTROSE-NACL 20-5-0.45 MEQ/L-%-% IV SOLN
INTRAVENOUS | Status: AC
  Filled 2024-07-12: qty 1000

## 2024-07-12 MED ORDER — ONDANSETRON HCL 4 MG/2ML IJ SOLN
INTRAMUSCULAR | Status: DC | PRN
  Administered 2024-07-12: 4 mg via INTRAVENOUS

## 2024-07-12 MED ORDER — ACETAMINOPHEN 10 MG/ML IV SOLN
INTRAVENOUS | Status: DC | PRN
  Administered 2024-07-12: 1000 mg via INTRAVENOUS

## 2024-07-12 MED ORDER — SIMETHICONE 40 MG/0.6ML PO SUSP: 40.0000 mg | Freq: Four times a day (QID) | ORAL | Status: DC | PRN

## 2024-07-12 MED ORDER — DEXAMETHASONE SODIUM PHOSPHATE 10 MG/ML IJ SOLN
INTRAMUSCULAR | Status: AC
Start: 2024-07-12 — End: 2024-07-12
  Filled 2024-07-12: qty 3

## 2024-07-12 MED ORDER — HYDROMORPHONE HCL 1 MG/ML IJ SOLN
0.2500 mg | INTRAMUSCULAR | Status: DC | PRN
  Administered 2024-07-12 (×2): 0.5 mg via INTRAVENOUS

## 2024-07-12 MED ORDER — KCL IN DEXTROSE-NACL 20-5-0.45 MEQ/L-%-% IV SOLN: INTRAVENOUS | Status: AC

## 2024-07-12 MED ORDER — AMISULPRIDE (ANTIEMETIC) 5 MG/2ML IV SOLN: 10.0000 mg | Freq: Once | INTRAVENOUS | Status: DC | PRN

## 2024-07-12 MED ORDER — BUPIVACAINE-EPINEPHRINE 0.25% -1:200000 IJ SOLN
INTRAMUSCULAR | Status: DC | PRN
  Administered 2024-07-12: 3.5 mL

## 2024-07-12 MED ORDER — LIDOCAINE HCL (PF) 2 % IJ SOLN
INTRAMUSCULAR | Status: AC
  Filled 2024-07-12: qty 5

## 2024-07-12 MED ORDER — METHOCARBAMOL 1000 MG/10ML IJ SOLN: 500.0000 mg | Freq: Three times a day (TID) | INTRAMUSCULAR | Status: DC | PRN

## 2024-07-12 MED ORDER — KCL IN DEXTROSE-NACL 20-5-0.45 MEQ/L-%-% IV SOLN
INTRAVENOUS | Status: DC
  Filled 2024-07-12 (×2): qty 1000

## 2024-07-12 MED ORDER — ONDANSETRON 4 MG PO TBDP: 4.0000 mg | ORAL_TABLET | Freq: Four times a day (QID) | ORAL | Status: DC | PRN

## 2024-07-12 MED ORDER — OXYCODONE HCL 5 MG/5ML PO SOLN: 5.0000 mg | Freq: Once | ORAL | Status: DC | PRN

## 2024-07-12 SURGICAL SUPPLY — 38 items
BAG COUNTER SPONGE SURGICOUNT (BAG) IMPLANT
CABLE HIGH FREQUENCY MONO STRZ (ELECTRODE) ×2 IMPLANT
CLIP APPLIE ROT 10 11.4 M/L (STAPLE) IMPLANT
CNTNR URN SCR LID CUP LEK RST (MISCELLANEOUS) IMPLANT
COVER SURGICAL LIGHT HANDLE (MISCELLANEOUS) ×2 IMPLANT
CUTTER FLEX LINEAR 45M (STAPLE) IMPLANT
DERMABOND ADVANCED .7 DNX12 (GAUZE/BANDAGES/DRESSINGS) ×2 IMPLANT
DRAPE LAPAROSCOPIC ABDOMINAL (DRAPES) ×2 IMPLANT
ELECT REM PT RETURN 15FT ADLT (MISCELLANEOUS) ×2 IMPLANT
ENDOLOOP SUT PDS II 0 18 (SUTURE) IMPLANT
GLOVE BIO SURGEON STRL SZ 6 (GLOVE) ×2 IMPLANT
GLOVE BIO SURGEON STRL SZ8 (GLOVE) IMPLANT
GLOVE BIOGEL PI IND STRL 7.5 (GLOVE) IMPLANT
GLOVE BIOGEL PI IND STRL 8 (GLOVE) IMPLANT
GLOVE INDICATOR 6.5 STRL GRN (GLOVE) ×2 IMPLANT
GOWN SPEC L3 XXLG W/TWL (GOWN DISPOSABLE) IMPLANT
GOWN STRL REUS W/ TWL XL LVL3 (GOWN DISPOSABLE) ×2 IMPLANT
IRRIGATION SUCT STRKRFLW 2 WTP (MISCELLANEOUS) ×2 IMPLANT
KIT BASIN OR (CUSTOM PROCEDURE TRAY) ×2 IMPLANT
KIT TURNOVER KIT A (KITS) ×2 IMPLANT
PENCIL SMOKE EVACUATOR (MISCELLANEOUS) IMPLANT
RELOAD 45 VASCULAR/THIN (ENDOMECHANICALS) IMPLANT
RELOAD STAPLE 45 2.5 WHT GRN (ENDOMECHANICALS) IMPLANT
RELOAD STAPLE 45 3.5 BLU ETS (ENDOMECHANICALS) IMPLANT
RELOAD STAPLE TA45 3.5 REG BLU (ENDOMECHANICALS) ×1 IMPLANT
SCISSORS LAP 5X35 DISP (ENDOMECHANICALS) IMPLANT
SET TUBE SMOKE EVAC HIGH FLOW (TUBING) ×2 IMPLANT
SHEARS HARMONIC 36 ACE (MISCELLANEOUS) ×2 IMPLANT
SLEEVE Z-THREAD 5X100MM (TROCAR) ×2 IMPLANT
SPIKE FLUID TRANSFER (MISCELLANEOUS) ×2 IMPLANT
SUT MNCRL AB 4-0 PS2 18 (SUTURE) ×2 IMPLANT
SUT VICRYL 0 UR6 27IN ABS (SUTURE) IMPLANT
SYSTEM BAG RETRIEVAL 10MM (BASKET) ×2 IMPLANT
TOWEL OR 17X26 10 PK STRL BLUE (TOWEL DISPOSABLE) ×2 IMPLANT
TRAY FOLEY MTR SLVR 16FR STAT (SET/KITS/TRAYS/PACK) IMPLANT
TRAY LAPAROSCOPIC (CUSTOM PROCEDURE TRAY) ×2 IMPLANT
TROCAR BALLN 12MMX100 BLUNT (TROCAR) ×2 IMPLANT
TROCAR Z-THREAD OPTICAL 5X100M (TROCAR) ×2 IMPLANT

## 2024-07-12 NOTE — ED Notes (Signed)
 This RN had wasted 0.5 mg dilaudid , at bedisde w/ remaining 0.5 mg dilaudid  to give pt. Pt education provided; pt verbalized understanding and agreeable to take medication. This RN about to administer medication and pt refused med, stating he didn't want any medication that makes him sleepy. This RN scanned med as pt refused/ not given. This RN notified MD before wasting the remaining 0.5 mg of dilaudid . MD to bedside. Pt now in agreement to take medication. This RN had the remaining 0.5 mg of dilaudid  in hand for the entire time span between pt refusing medication and discussion w/ MD. 0.5 mg dilaudid  given. This RN did not re-scan medication bc it was already scanned two minutes prior.

## 2024-07-12 NOTE — ED Triage Notes (Signed)
 Pt POV c/o epigastric abd pain since this AM.  Denies n/v/d/fever. Pepto bismol ineffective.   Also reports increased urinary frequency since this AM.   Pt visibly anxious in triage, reports hx of panic attacks.

## 2024-07-12 NOTE — Progress Notes (Signed)
 Patient arrived from [facility] with a piece of tape on his shirt that states Patient is allergic to ibuprofen  and fentanyl 

## 2024-07-12 NOTE — ED Provider Notes (Signed)
 [place] [facility] Provider Note   CSN: [id number] Arrival date & time: 07/12/24  1132     Patient presents with: Abdominal Pain   Caleb Glover is a 23 y.o. male.   Patient complains of right lower quadrant abdominal pain that started today.  Patient reports he began having pain in the upper epigastric area but felt a cordlike sensation going to his lower abdomen that is now consistently in his right lower quadrant.  Patient experienced nausea this a.m. he has been drinking fluids before coming to the emergency department.  Patient denies nausea vomiting or diarrhea.  He did have some stool that was loose.  Patient reports he has not had a fever or chills.  Patient denies any other medical problems.  The history is provided by the patient. No language interpreter was used.  Abdominal Pain Associated symptoms: no fever        Prior to Admission medications   Medication Sig Start Date End Date Taking? Authorizing Provider  doxycycline  (VIBRAMYCIN ) 100 MG capsule Take 1 capsule (100 mg total) by mouth 2 (two) times daily. Patient not taking: Reported on 02/28/2024 06/03/23   Yolande Lamar BROCKS, MD  omeprazole  (PRILOSEC) 20 MG capsule Take 1 capsule (20 mg total) by mouth daily for 10 days. Patient not taking: Reported on 02/28/2024 10/11/21 10/21/21  Smoot, Lauraine LABOR, PA-C  Vitamin D, Ergocalciferol, (DRISDOL) 1.25 MG (50000 UNIT) CAPS capsule Take by mouth. 02/11/22   [provider]    Allergies: Peach [prunus persica], Shrimp extract, and Benadryl  [diphenhydramine ]    Review of Systems  Constitutional:  Negative for fever.  Gastrointestinal:  Positive for abdominal pain.  All other systems reviewed and are negative.   Updated Vital Signs BP 111/78 (BP Location: Right Arm)   Pulse 78   Temp 98.7 F (37.1 C) (Oral)   Resp 18   Ht 5' 6 (1.676 m)   Wt 57.2 kg   SpO2 100%   BMI 20.34 kg/m   Physical Exam Vitals and  nursing note reviewed.  Constitutional:      Appearance: He is well-developed.  HENT:     Head: Normocephalic.  Cardiovascular:     Rate and Rhythm: Normal rate.  Pulmonary:     Effort: Pulmonary effort is normal.  Abdominal:     General: Bowel sounds are normal. There is no distension.     Tenderness: There is abdominal tenderness in the right lower quadrant. There is rebound.     Hernia: No hernia is present.  Musculoskeletal:        General: Normal range of motion.     Cervical back: Normal range of motion.  Skin:    General: Skin is warm.  Neurological:     General: No focal deficit present.     Mental Status: He is alert and oriented to person, place, and time.  Psychiatric:        Mood and Affect: Mood is anxious.     (all labs ordered are listed, but only abnormal results are displayed) Labs Reviewed  CBC - Abnormal; Notable for the following components:      Result Value   WBC 12.5 (*)    All other components within normal limits  URINALYSIS, ROUTINE W REFLEX MICROSCOPIC - Abnormal; Notable for the following components:   Color, Urine STRAW (*)    All other components within normal limits  LIPASE, BLOOD  COMPREHENSIVE METABOLIC PANEL WITH GFR  HIV ANTIBODY (ROUTINE  TESTING W REFLEX)    EKG: EKG Interpretation Date/Time:  Monday July 12 2024 11:40:47 EDT Ventricular Rate:  84 PR Interval:  148 QRS Duration:  85 QT Interval:  369 QTC Calculation: 437 R Axis:   77  Text Interpretation: Sinus rhythm Biatrial enlargement RSR' in V1 or V2, probably normal variant No significant change since last tracing Confirmed by Dean Clarity [phone]) on 07/12/2024 11:55:24 AM  Radiology: CT ABDOMEN PELVIS W CONTRAST Result Date: 07/12/2024 CLINICAL DATA:  Right lower quadrant abdominal pain EXAM: CT ABDOMEN AND PELVIS WITH CONTRAST TECHNIQUE: Multidetector CT imaging of the abdomen and pelvis was performed using the standard protocol following bolus administration of  intravenous contrast. RADIATION DOSE REDUCTION: This exam was performed according to the departmental dose-optimization program which includes automated exposure control, adjustment of the mA and/or kV according to patient size and/or use of iterative reconstruction technique. CONTRAST:  OMNIPAQUE  IOHEXOL  300 MG/ML  SOLN COMPARISON:  CT abdomen pelvis February 26, 2023 FINDINGS: Lower chest: No acute abnormality. Hepatobiliary: No focal liver abnormality is seen. No gallstones, gallbladder wall thickening, or biliary dilatation. Pancreas: Unremarkable. No pancreatic ductal dilatation or surrounding inflammatory changes. Spleen: Normal in size without focal abnormality. Adrenals/Urinary Tract: Adrenal glands are unremarkable. Kidneys are normal, without renal calculi, focal lesion, or hydronephrosis. Bladder is unremarkable. Stomach/Bowel: Stomach is within normal limits. There is a tubular structure in right lower quadrant adjacent to the bladder with a diameter up to 9 mm and mucosal enhancement likely representing an inflamed appendix. Discrete connection to the cecum is not well seen due to overlying bowel loops. (2/58, 54). Trace fluid is identified in right lower quadrant further confirming the possibility of acute appendicitis. Remainder of the bowel loops are unremarkable. Vascular/Lymphatic: No significant vascular findings are present. No enlarged abdominal or pelvic lymph nodes. Reproductive: Prostate is unremarkable. Other: No abdominal wall hernia or abnormality. No abdominopelvic ascites. Musculoskeletal: No acute or significant osseous findings.Scattered pelvic sclerotic lesions likely bone islands. IMPRESSION: Suggestion of acute appendicitis and trace pelvic fluid. Correlate with clinical findings. Electronically Signed   By: Megan  Zare M.D.   On: 07/12/2024 14:19     Procedures   Medications Ordered in the ED  HYDROmorphone  (DILAUDID ) injection 0.5 mg (0.5 mg Intravenous Patient  Refused/Not Given 07/12/24 1508)  ondansetron  (ZOFRAN ) injection 4 mg (has no administration in time range)  cefTRIAXone  (ROCEPHIN ) 2 g in sodium chloride  0.9 % 100 mL IVPB (has no administration in time range)  metroNIDAZOLE  (FLAGYL ) IVPB 500 mg (has no administration in time range)  enoxaparin  (LOVENOX ) injection 40 mg (has no administration in time range)  dextrose  5 % and 0.45 % NaCl with KCl 20 mEq/L infusion (has no administration in time range)  acetaminophen  (TYLENOL ) tablet 1,000 mg (has no administration in time range)  oxyCODONE  (Oxy IR/ROXICODONE ) immediate release tablet 5-10 mg (has no administration in time range)  morphine  (PF) 2 MG/ML injection 1-4 mg (has no administration in time range)  melatonin tablet 3 mg (has no administration in time range)  ondansetron  (ZOFRAN -ODT) disintegrating tablet 4 mg (has no administration in time range)    Or  ondansetron  (ZOFRAN ) injection 4 mg (has no administration in time range)  simethicone  (MYLICON) 40 MG/0.6ML suspension 40 mg (has no administration in time range)  iohexol  (OMNIPAQUE ) 300 MG/ML solution 100 mL (100 mLs Intravenous Contrast Given 07/12/24 1343)  Medical Decision Making Patient complains of right lower quadrant abdominal pain that started today.  Amount and/or Complexity of Data Reviewed Labs: ordered. Decision-making details documented in ED Course.    Details: Labs ordered reviewed and interpreted patient has an elevated white blood cell count of 12.5.  UA is negative Radiology: ordered.    Details: CT abdomen and pelvis ordered reviewed and interpreted.  Radiologist reports acute appendicitis ECG/medicine tests: ordered and independent interpretation performed. Decision-making details documented in ED Course.    Details: EKG normal sinus rhythm normal EKG Discussion of management or test interpretation with external provider(s): Patient discussed with Dr. Lyndel general  surgeon.  He advised to keep the patient n.p.o. he may be able to do surgery today.  He advise given patient IV antibiotics.  Risk Prescription drug management. Decision regarding hospitalization.        Final diagnoses:  Primary insomnia  Weakness  Acute appendicitis, unspecified acute appendicitis type    ED Discharge Orders     None          Flint Sonny MARLA DEVONNA 07/12/24 1534    Dean Clarity, MD 07/12/24 [phone]

## 2024-07-12 NOTE — Anesthesia Procedure Notes (Signed)
 Procedure Name: Intubation Date/Time: 07/12/2024 7:35 PM  Performed by: Cena Epps, CRNAPre-anesthesia Checklist: Patient identified, Emergency Drugs available, Suction available and Patient being monitored Patient Re-evaluated:Patient Re-evaluated prior to induction Oxygen Delivery Method: Circle System Utilized Preoxygenation: Pre-oxygenation with 100% oxygen Induction Type: IV induction Ventilation: Mask ventilation without difficulty Laryngoscope Size: Mac and 3 Grade View: Grade I Tube type: Oral Tube size: 7.0 mm Number of attempts: 1 Airway Equipment and Method: Stylet and Oral airway Placement Confirmation: ETT inserted through vocal cords under direct vision, positive ETCO2 and breath sounds checked- equal and bilateral Secured at: 22 cm Tube secured with: Tape Dental Injury: Teeth and Oropharynx as per pre-operative assessment

## 2024-07-12 NOTE — Anesthesia Preprocedure Evaluation (Addendum)
 Anesthesia Evaluation  Patient identified by MRN, date of birth, ID band Patient awake    Reviewed: Allergy & Precautions, NPO status , Patient's Chart, lab work & pertinent test results  History of Anesthesia Complications (+) Family history of anesthesia reaction  Airway Mallampati: II  TM Distance: >3 FB Neck ROM: Full    Dental no notable dental hx. (+) Dental Advisory Given, Teeth Intact   Pulmonary neg pulmonary ROS   Pulmonary exam normal breath sounds clear to auscultation       Cardiovascular negative cardio ROS  Rhythm:Regular Rate:Tachycardia     Neuro/Psych  PSYCHIATRIC DISORDERS Anxiety     negative neurological ROS     GI/Hepatic negative GI ROS, Neg liver ROS,,,  Endo/Other  negative endocrine ROS    Renal/GU negative Renal ROS     Musculoskeletal negative musculoskeletal ROS (+)    Abdominal   Peds  Hematology negative hematology ROS (+)   Anesthesia Other Findings   Reproductive/Obstetrics                              Anesthesia Physical Anesthesia Plan  ASA: 2 and emergent  Anesthesia Plan: General   Post-op Pain Management: Ofirmev  IV (intra-op)*, Toradol IV (intra-op)* and Precedex    Induction: Intravenous, Rapid sequence and Cricoid pressure planned  PONV Risk Score and Plan: 4 or greater and Ondansetron , Dexamethasone , Treatment may vary due to age or medical condition, Midazolam , Propofol  infusion and TIVA  Airway Management Planned: Oral ETT  Additional Equipment:   Intra-op Plan:   Post-operative Plan: Extubation in OR  Informed Consent: I have reviewed the patients History and Physical, chart, labs and discussed the procedure including the risks, benefits and alternatives for the proposed anesthesia with the patient or authorized representative who has indicated his/her understanding and acceptance.     Dental advisory given  Plan Discussed  with: CRNA  Anesthesia Plan Comments:          Anesthesia Quick Evaluation

## 2024-07-12 NOTE — Op Note (Signed)
 Laparoscopic Appendectomy  Indications: The patient presented with a history of right-sided abdominal pain. A CT revealed findings consistent with acute appendicitis.  Pre-operative Diagnosis: acute appendicitis  Post-operative Diagnosis: Same  Surgeon: Jina Nephew   Assistants: n/a  Anesthesia: General endotracheal anesthesia and Local anesthesia 1% plain lidocaine , 0.25.% bupivacaine , with epinephrine   ASA Class: 2, E  Procedure Details  The patient was seen again in the Holding Room. The risks, benefits, complications, treatment options, and expected outcomes were discussed with the patient and/or family. The possibilities of perforation of viscus, bleeding, recurrent infection, the need for additional procedures, failure to diagnose a condition, and creating a complication requiring transfusion or operation were discussed. There was concurrence with the proposed plan and informed consent was obtained. The site of surgery was properly noted. The patient was taken to Operating Room, identified as Fordyce Trinidad-Marcial and the procedure verified as Appendectomy. A Time Out was held and the above information confirmed.  The patient was placed in the supine position and general anesthesia was induced, along with placement of orogastric tube, Venodyne boots, and a Foley catheter. The abdomen was prepped and draped in a sterile fashion. Local anesthetic was infiltrated in the infraumbilical region.  A 1.5 cm transverse curvilinear incision was made just below the umbilicus.  The Kelly clamp was used to spread the subcutaneous tissues.  The fascia was elevated with 2 Kocher clamps and incised with the #11 blade.  A Burnard was used to confirm entrance into the peritoneal cavity.  A pursestring suture was placed around the fascial incision.  The Hasson trocar was inserted into the abdomen and held in place with the tails of the suture.  The pneumoperitoneum was then established to steady pressure of 15  mmHg.     Additional 5 mm cannulas then placed in the left lower quadrant of the abdomen and the suprapubic region under direct visualization.  A careful evaluation of the entire abdomen was carried out. The patient was placed in Trendelenburg and rotated to the left.  The small intestines were retracted in the cephalad and left lateral direction away from the pelvis and right lower quadrant. The patient was found to have an enlarged and inflamed appendix in the pelvic position. There was no evidence of perforation.  The appendix was carefully dissected. The appendix was was skeletonized with the harmonic scalpel.   The appendix was divided at its base using an endo-GIA stapler. Minimal appendiceal stump was left in place. The appendix was removed from the abdomen with an Endocatch bag through the umbilical port.  There was no evidence of bleeding, leakage, or complication after division of the appendix. Irrigation was also performed and irrigate suctioned from the abdomen as well.  The 5 mm trocars were removed.  The pneumoperitoneum was evacuated from the abdomen.    The trocar site skin wounds were closed with 4-0 Monocryl and dressed with Dermabond.  Instrument, sponge, and needle counts were correct at the conclusion of the case.   Findings: The appendix was found to be inflamed. There were not signs of necrosis.  There was not perforation. There was not abscess formation.  Estimated Blood Loss:  Minimal         Drains: none          Specimens: appendix         Complications:  None; patient tolerated the procedure well.         Disposition: PACU - hemodynamically stable.  Condition: stable

## 2024-07-12 NOTE — Transfer of Care (Signed)
 Immediate Anesthesia Transfer of Care Note  Patient: Caleb Glover  Procedure(s) Performed: APPENDECTOMY, LAPAROSCOPIC (Abdomen)  Patient Location: PACU  Anesthesia Type:General  Level of Consciousness: sedated  Airway & Oxygen Therapy: Patient Spontanous Breathing and Patient connected to face mask oxygen  Post-op Assessment: Report given to RN and Post -op Vital signs reviewed and stable  Post vital signs: Reviewed and stable  Last Vitals:  Vitals Value Taken Time  BP 96/60 07/12/24 20:45  Temp    Pulse 77 07/12/24 20:46  Resp 18 07/12/24 20:46  SpO2 100 % 07/12/24 20:46  Vitals shown include unfiled device data.  Last Pain:  Vitals:   07/12/24 1803  TempSrc: Oral  PainSc: 8       Patients Stated Pain Goal: 0 (07/12/24 1803)  Complications: No notable events documented.

## 2024-07-12 NOTE — H&P (Signed)
 Caleb Glover Mar 18, 2001  [id number].    Chief Complaint/Reason for Consult: acute appendicitis  HPI:  This is a 23 yo male with no significant medical history, except panic attacks who presents to [facility] with pain in his central abdomen that has migrated to his RLQ.  This started this morning.  He drank some ginger ale around 1100am to see if that would help with his pain.  He thought he ate something bad at first.  He denies any N/V/D or fevers.  He did try some Pepto Bismol as well, which didn't help.  Because of persistent pain he presented to the ED where he underwent a CT scan that is concerning for acute appendicitis.  His WBC is 12K.    ROS: Review of Systems  All other systems reviewed and are negative. : see HPI  History reviewed. No pertinent family history.  Past Medical History:  Diagnosis Date   Complication of anesthesia    Family history of adverse reaction to anesthesia    Panic attacks    Vitamin D deficiency     Past Surgical History:  Procedure Laterality Date   WISDOM TOOTH EXTRACTION      Social History:  reports that he has never smoked. He has never used smokeless tobacco. He reports that he does not drink alcohol and does not use drugs.  Allergies:  Allergies  Allergen Reactions   Fentanyl  Other (See Comments)    Tightening of throat; increases anxiety   Ibuprofen  Other (See Comments)    Increases anxiety   Peach [Prunus Persica] Anaphylaxis   Shrimp Extract Anaphylaxis   Benadryl  [Diphenhydramine ] Swelling    Medications Prior to Admission  Medication Sig Dispense Refill   Vitamin D, Ergocalciferol, (DRISDOL) 1.25 MG (50000 UNIT) CAPS capsule Take by mouth.     doxycycline  (VIBRAMYCIN ) 100 MG capsule Take 1 capsule (100 mg total) by mouth 2 (two) times daily. (Patient not taking: Reported on 02/28/2024) 20 capsule 0   omeprazole  (PRILOSEC) 20 MG capsule Take 1 capsule (20 mg total) by mouth daily for 10 days. (Patient not taking:  Reported on 02/28/2024) 10 capsule 0     Physical Exam: Blood pressure 131/83, pulse (!) 102, temperature 98.1 F (36.7 C), temperature source Oral, resp. rate 16, height 5' 6 (1.676 m), weight 57.2 kg, SpO2 99%.  General: pleasant, WD, WN male who is laying in bed in NAD HEENT: head is normocephalic, atraumatic.  Sclera are noninjected.  PERRL.  Ears and nose without any masses or lesions.  Mouth is pink and moist Heart: regular, rate, and rhythm.  Palpable radial and pedal pulses bilaterally Lungs: Respiratory effort nonlabored Abd: soft, ND, tender periumbilical, suprapubic, RLQ MS: all 4 extremities are symmetrical with no cyanosis, clubbing, or edema. Skin: warm and dry with no masses, lesions, or rashes Neuro: Cranial nerves 2-12 grossly intact, sensation is normal throughout Psych: A&Ox3 with an appropriate affect.   Results for orders placed or performed during the hospital encounter of 07/12/24 (from the past 48 hours)  Urinalysis, Routine w reflex microscopic -Urine, Clean Catch     Status: Abnormal   Collection Time: 07/12/24 11:41 AM  Result Value Ref Range   Color, Urine STRAW (A) YELLOW   APPearance CLEAR CLEAR   Specific Gravity, Urine 1.010 1.005 - 1.030   pH 7.0 5.0 - 8.0   Glucose, UA NEGATIVE NEGATIVE mg/dL   Hgb urine dipstick NEGATIVE NEGATIVE   Bilirubin Urine NEGATIVE NEGATIVE   Ketones, ur NEGATIVE  NEGATIVE mg/dL   Protein, ur NEGATIVE NEGATIVE mg/dL   Nitrite NEGATIVE NEGATIVE   Leukocytes,Ua NEGATIVE NEGATIVE    Comment: Microscopic not done on urines with negative protein, blood, leukocytes, nitrite, or glucose < 500 mg/dL. Performed at [facility], [address], [place], [place] [address]   Lipase, blood     Status: None   Collection Time: 07/12/24 11:43 AM  Result Value Ref Range   Lipase 20 11 - 51 U/L    Comment: Performed at [facility], [address] [place], [place] [address]  Comprehensive metabolic panel      Status: None   Collection Time: 07/12/24 11:43 AM  Result Value Ref Range   Sodium 140 135 - 145 mmol/L   Potassium 3.6 3.5 - 5.1 mmol/L   Chloride 103 98 - 111 mmol/L   CO2 25 22 - 32 mmol/L   Glucose, Bld 93 70 - 99 mg/dL    Comment: Glucose reference range applies only to samples taken after fasting for at least 8 hours.   BUN 11 6 - 20 mg/dL   Creatinine, Ser 9.17 0.61 - 1.24 mg/dL   Calcium 9.5 8.9 - 89.6 mg/dL   Total Protein 7.7 6.5 - 8.1 g/dL   Albumin 4.9 3.5 - 5.0 g/dL   AST 22 15 - 41 U/L   ALT 19 0 - 44 U/L   Alkaline Phosphatase 87 38 - 126 U/L   Total Bilirubin 0.6 0.0 - 1.2 mg/dL   GFR, Estimated >39 >39 mL/min    Comment: (NOTE) Calculated using the CKD-EPI Creatinine Equation (2021)    Anion gap 12 5 - 15    Comment: Performed at [facility], [address] [place], [place] [address]  CBC     Status: Abnormal   Collection Time: 07/12/24 11:43 AM  Result Value Ref Range   WBC 12.5 (H) 4.0 - 10.5 K/uL   RBC 4.84 4.22 - 5.81 MIL/uL   Hemoglobin 15.4 13.0 - 17.0 g/dL   HCT 56.3 60.9 - 47.9 %   MCV 90.1 80.0 - 100.0 fL   MCH 31.8 26.0 - 34.0 pg   MCHC 35.3 30.0 - 36.0 g/dL   RDW 87.8 88.4 - 84.4 %   Platelets 220 150 - 400 K/uL   nRBC 0.0 0.0 - 0.2 %    Comment: Performed at [facility], [address] [place], [place] [address]   CT ABDOMEN PELVIS W CONTRAST Result Date: 07/12/2024 CLINICAL DATA:  Right lower quadrant abdominal pain EXAM: CT ABDOMEN AND PELVIS WITH CONTRAST TECHNIQUE: Multidetector CT imaging of the abdomen and pelvis was performed using the standard protocol following bolus administration of intravenous contrast. RADIATION DOSE REDUCTION: This exam was performed according to the departmental dose-optimization program which includes automated exposure control, adjustment of the mA and/or kV according to patient size and/or use of iterative reconstruction technique. CONTRAST:  OMNIPAQUE  IOHEXOL  300 MG/ML  SOLN  COMPARISON:  CT abdomen pelvis February 26, 2023 FINDINGS: Lower chest: No acute abnormality. Hepatobiliary: No focal liver abnormality is seen. No gallstones, gallbladder wall thickening, or biliary dilatation. Pancreas: Unremarkable. No pancreatic ductal dilatation or surrounding inflammatory changes. Spleen: Normal in size without focal abnormality. Adrenals/Urinary Tract: Adrenal glands are unremarkable. Kidneys are normal, without renal calculi, focal lesion, or hydronephrosis. Bladder is unremarkable. Stomach/Bowel: Stomach is within normal limits. There is a tubular structure in right lower quadrant adjacent to the bladder with a diameter  up to 9 mm and mucosal enhancement likely representing an inflamed appendix. Discrete connection to the cecum is not well seen due to overlying bowel loops. (2/58, 54). Trace fluid is identified in right lower quadrant further confirming the possibility of acute appendicitis. Remainder of the bowel loops are unremarkable. Vascular/Lymphatic: No significant vascular findings are present. No enlarged abdominal or pelvic lymph nodes. Reproductive: Prostate is unremarkable. Other: No abdominal wall hernia or abnormality. No abdominopelvic ascites. Musculoskeletal: No acute or significant osseous findings.Scattered pelvic sclerotic lesions likely bone islands. IMPRESSION: Suggestion of acute appendicitis and trace pelvic fluid. Correlate with clinical findings. Electronically Signed   By: Megan  Zare M.D.   On: 07/12/2024 14:19      Assessment/Plan Acute appendicitis The patient has been seen, examined, vitals, chart, labs, and imaging reviewed.  He does appear to have appendicitis.  We have started abx therapy and plan to proceed to the OR for a lap appy.  Remain NPO for now.     FEN - npo, ADAT post op VTE - lovenox  post op ID - rocephin /flagyl  Admit - 24 hour obs  Appendectomy was described to the patient.  The incisions and surgical technique were explained.   The patient was advised that some of the hair on the abdomen would be clipped, and that a foley catheter would be placed.  I advised the patient of the risks of surgery including, but not limited to, bleeding, infection, damage to other structures, risk of an open operation, risk of abscess, and risk of blood clot.  The recovery was also described to the patient.  He was advised that he will have lifting restrictions for 2 weeks.     I reviewed last 24 h vitals and pain scores, last 48 h intake and output, last 24 h labs and trends, and last 24 h imaging results.  Jina LITTIE Nephew, MD, FACS, FSSO Surgical Oncology, General Surgery, Trauma and Critical [facility], [place] [phone] for weekday/non holidays Check [email or web address] for coverage night/weekend/holidays   [facility] 07/12/2024, 6:49 PM Please see Amion for pager number during day hours 7:00am-4:30pm or 7:00am -11:30am on weekends

## 2024-07-13 ENCOUNTER — Encounter (HOSPITAL_COMMUNITY): Payer: Self-pay | Admitting: General Surgery

## 2024-07-13 ENCOUNTER — Other Ambulatory Visit (HOSPITAL_COMMUNITY): Payer: Self-pay

## 2024-07-13 MED ORDER — ACETAMINOPHEN 500 MG PO TABS: 1000.0000 mg | ORAL_TABLET | Freq: Four times a day (QID) | ORAL | Status: AC | PRN

## 2024-07-13 MED ORDER — METHOCARBAMOL 500 MG PO TABS
500.0000 mg | ORAL_TABLET | Freq: Three times a day (TID) | ORAL | 0 refills | Status: AC | PRN
  Filled 2024-07-13: qty 30, 10d supply, fill #0

## 2024-07-13 MED ORDER — OXYCODONE HCL 5 MG PO TABS
5.0000 mg | ORAL_TABLET | Freq: Four times a day (QID) | ORAL | 0 refills | Status: AC | PRN
Start: 2024-07-13 — End: ?
  Filled 2024-07-13: qty 15, 4d supply, fill #0

## 2024-07-13 NOTE — Progress Notes (Signed)
 Patient was given discharge instructions, and all questions were answered.  Patient was stable for discharge and was taken to the main exit by wheelchair.

## 2024-07-13 NOTE — Discharge Instructions (Signed)

## 2024-07-13 NOTE — Progress Notes (Signed)
   07/13/24 1141  TOC Brief Assessment  Insurance and Status Reviewed  Patient has primary care physician No  Home environment has been reviewed apartment  Prior level of function: independent  Prior/Current Home Services No current home services  Social Drivers of Health Review SDOH reviewed no interventions necessary  Readmission risk has been reviewed Yes  Transition of care needs no transition of care needs at this time    Heather Saltness, MSW, LCSW 07/13/2024 11:42 AM

## 2024-07-13 NOTE — Discharge Summary (Signed)
    Patient ID: Caleb Glover [id number] [birth year] 23 y.o.  Admit date: 07/12/2024 Discharge date: 07/13/2024  Admitting Diagnosis: Acute appendicitis  Discharge Diagnosis Patient Active Problem List   Diagnosis Date Noted   Acute appendicitis 07/12/2024    Consultants none  Reason for Admission: This is a 23 yo male with no significant medical history, except panic attacks who presents to [facility] with pain in his central abdomen that has migrated to his RLQ.  This started this morning.  He drank some ginger ale around 1100am to see if that would help with his pain.  He thought he ate something bad at first.  He denies any N/V/D or fevers.  He did try some Pepto Bismol as well, which didn't help.  Because of persistent pain he presented to the ED where he underwent a CT scan that is concerning for acute appendicitis.  His WBC is 12K.   Procedures Lap appy, Dr. Aron 7/14  Hospital Course:  The patient was admitted and underwent a laparoscopic appendectomy.  The patient tolerated the procedure well.  On POD 1, the patient was tolerating a regular diet, voiding well, mobilizing, and pain was controlled with oral pain medications.  The patient was stable for DC home at this time with appropriate follow up made.   Physical Exam: Abd: soft, appropriately tender, ND, incisions c/d/i  Allergies as of 07/13/2024       Reactions   Benadryl  [diphenhydramine ] Anaphylaxis   Fentanyl  Other (See Comments)   Tightening of throat; increases anxiety   Ibuprofen  Other (See Comments)   Increases anxiety   Peach [prunus Persica] Anaphylaxis   Shrimp Extract Anaphylaxis        Medication List     TAKE these medications    acetaminophen  500 MG tablet Commonly known as: TYLENOL  Take 2 tablets (1,000 mg total) by mouth every 6 (six) hours as needed.   cholecalciferol 25 MCG (1000 UNIT) tablet Commonly known as: VITAMIN D3 Take 1,000 Units by mouth daily.   methocarbamol  500 MG  tablet Commonly known as: ROBAXIN  Take 1 tablet (500 mg total) by mouth every 8 (eight) hours as needed for muscle spasms.   oxyCODONE  5 MG immediate release tablet Commonly known as: Oxy IR/ROXICODONE  Take 1 tablet (5 mg total) by mouth every 6 (six) hours as needed (pain).          Follow-up Information     Maczis, Puja Gosai, PA-C Follow up in 3 week(s).   Specialty: General Surgery Why: Office will call you with a follow up appointment, If you don't hear from the office, please call, Arrive 30 minutes prior to your appointment time, Please bring your insurance card and photo ID Contact information: [address] [place] [address] [facility] [place] [address] [phone]                 Signed: Burnard Banter, [facility] 07/13/2024, 8:50 AM Please see Amion for pager number during day hours 7:00am-4:30pm, 7-11:30am on Weekends

## 2024-07-14 LAB — SURGICAL PATHOLOGY

## 2024-07-14 NOTE — Anesthesia Postprocedure Evaluation (Signed)
 Anesthesia Post Note  Patient: Caleb Glover  Procedure(s) Performed: APPENDECTOMY, LAPAROSCOPIC (Abdomen)     Patient location during evaluation: PACU Anesthesia Type: General Level of consciousness: sedated and patient cooperative Pain management: pain level controlled Vital Signs Assessment: post-procedure vital signs reviewed and stable Respiratory status: spontaneous breathing Cardiovascular status: stable Anesthetic complications: no   No notable events documented.  Last Vitals:  Vitals:   07/13/24 0533 07/13/24 1016  BP: 104/63 110/65  Pulse: 70 68  Resp: 18 18  Temp: 36.5 C 36.7 C  SpO2: 99% 100%    Last Pain:  Vitals:   07/13/24 1138  TempSrc:   PainSc: 6                  Norleen Pope
